# Patient Record
Sex: Male | Born: 1945 | Race: White | Hispanic: No | Marital: Married | Smoking: Never smoker
Health system: Southern US, Community
[De-identification: ages and names within clinical notes are randomized; demographics above are authoritative.]

## PROBLEM LIST (undated history)

## (undated) DIAGNOSIS — R42 Dizziness and giddiness: Secondary | ICD-10-CM

## (undated) DIAGNOSIS — I10 Essential (primary) hypertension: Secondary | ICD-10-CM

## (undated) HISTORY — PX: BACK SURGERY: SHX140

---

## 2017-05-07 DIAGNOSIS — E7800 Pure hypercholesterolemia, unspecified: Secondary | ICD-10-CM | POA: Insufficient documentation

## 2017-05-07 DIAGNOSIS — Z800 Family history of malignant neoplasm of digestive organs: Secondary | ICD-10-CM | POA: Insufficient documentation

## 2017-05-07 DIAGNOSIS — K4090 Unilateral inguinal hernia, without obstruction or gangrene, not specified as recurrent: Secondary | ICD-10-CM | POA: Insufficient documentation

## 2017-06-03 DIAGNOSIS — M5417 Radiculopathy, lumbosacral region: Secondary | ICD-10-CM | POA: Insufficient documentation

## 2017-09-16 DIAGNOSIS — I10 Essential (primary) hypertension: Secondary | ICD-10-CM | POA: Insufficient documentation

## 2019-07-13 ENCOUNTER — Other Ambulatory Visit: Payer: Self-pay | Admitting: Family Medicine

## 2019-07-13 DIAGNOSIS — M25511 Pain in right shoulder: Secondary | ICD-10-CM

## 2019-07-25 ENCOUNTER — Ambulatory Visit
Admission: RE | Admit: 2019-07-25 | Discharge: 2019-07-25 | Disposition: A | Discharge status: Home / Self Care | Payer: Medicare Other | Source: Ambulatory Visit | Attending: Family Medicine | Admitting: Family Medicine

## 2019-07-25 DIAGNOSIS — M25511 Pain in right shoulder: Secondary | ICD-10-CM | POA: Diagnosis present

## 2019-07-25 IMAGING — MR MR SHOULDER*R* W/O CM
5 series · 36 of 40 positions shown · non-contrast
Comparison: Report of shoulder radiographs dated [DATE]

CLINICAL DATA: Chronic progressive right shoulder pain radiating to
the chest wall.

EXAM:
MRI OF THE RIGHT SHOULDER WITHOUT CONTRAST
TECHNIQUE: Multiplanar, multisequence MR imaging of the shoulder was performed.
No intravenous contrast was administered.

[Series 3: PD fat-sat · axial · right · 4.0mm · 0.55mm/px · z∈[+30,+159]mm · 8 of 28 slices shown (1 of 2)]
[im 1/28]
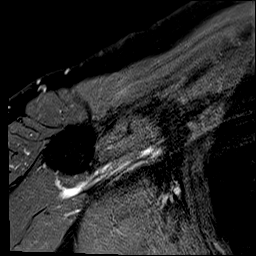
[im 4/28]
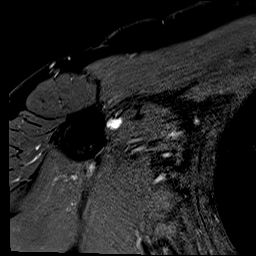
[im 8/28]
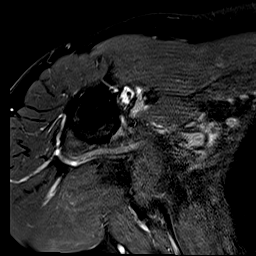
[im 12/28]
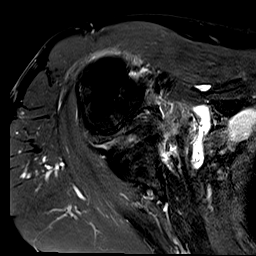
[im 16/28]
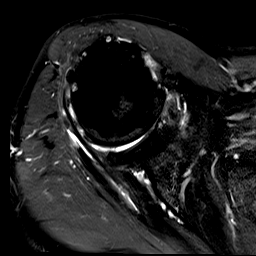
[im 20/28]
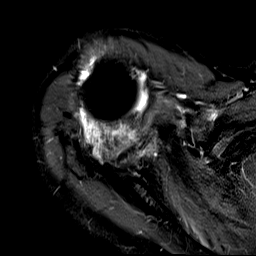
[im 24/28]
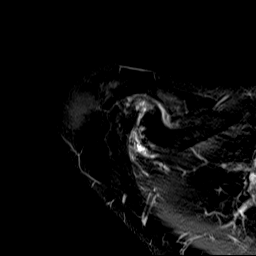
[im 28/28]
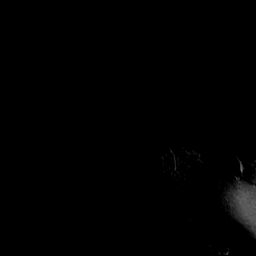

[Series 4: PD fat-sat · oblique · right · 4.0mm · 0.44mm/px · 8 of 26 slices shown (2 of 2)]
[im 1/26]
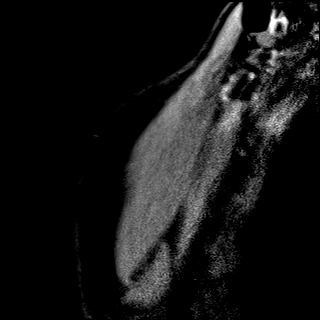
[im 4/26]
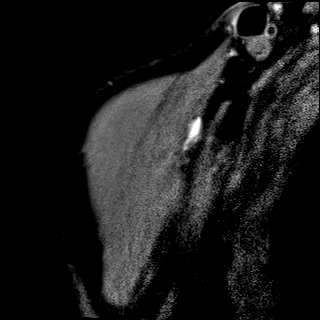
[im 8/26]
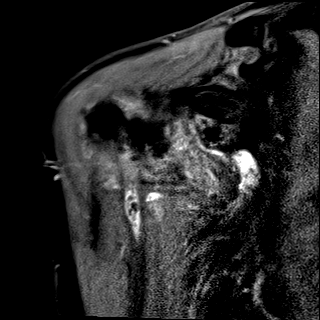
[im 11/26]
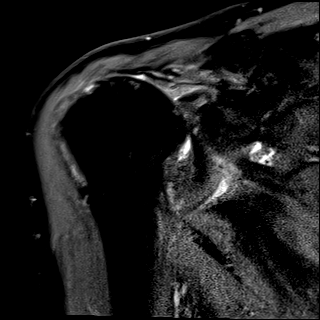
[im 15/26]
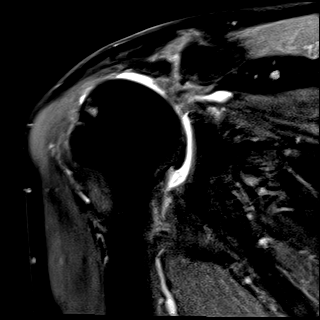
[im 18/26]
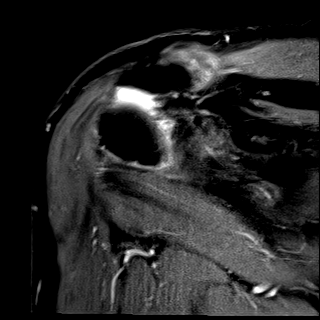
[im 22/26]
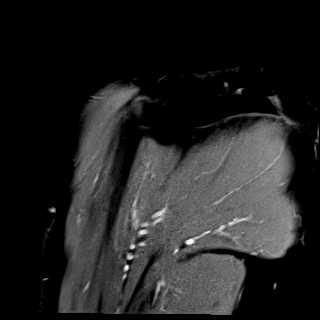
[im 26/26]
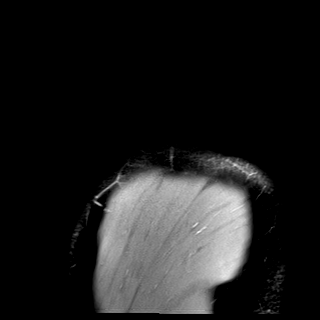

[Series 5: T2 fat-sat · oblique · right · 4.0mm · 0.44mm/px · 8 of 26 slices shown (1 of 2)]
[im 1/26]
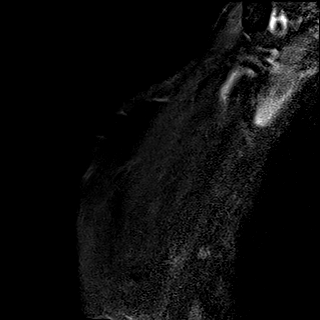
[im 4/26]
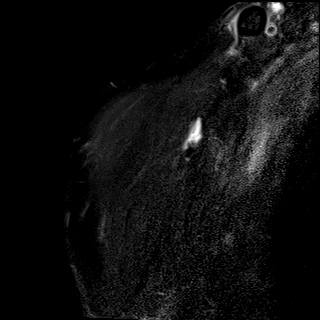
[im 8/26]
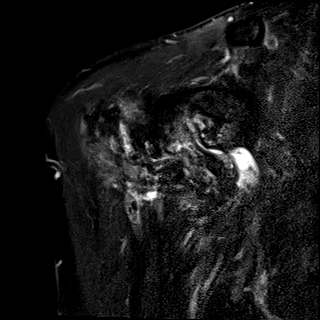
[im 11/26]
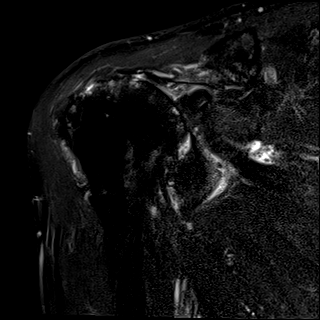
[im 15/26]
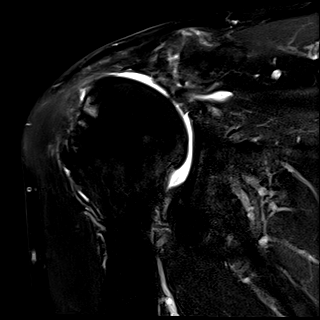
[im 18/26]
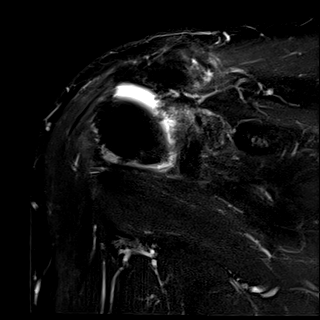
[im 22/26]
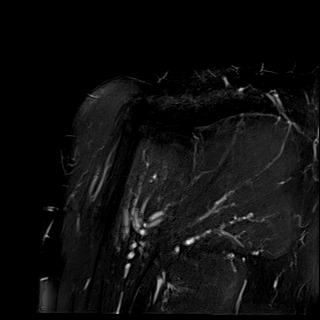
[im 26/26]
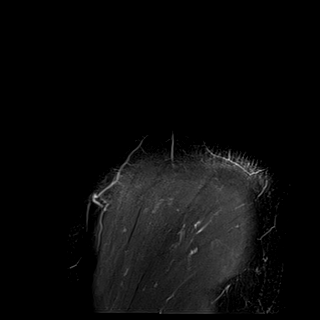

[Series 6: T2 fat-sat · coronal · right · 4.0mm · 0.27mm/px · 8 of 24 slices shown (2 of 2)]
[im 1/24]
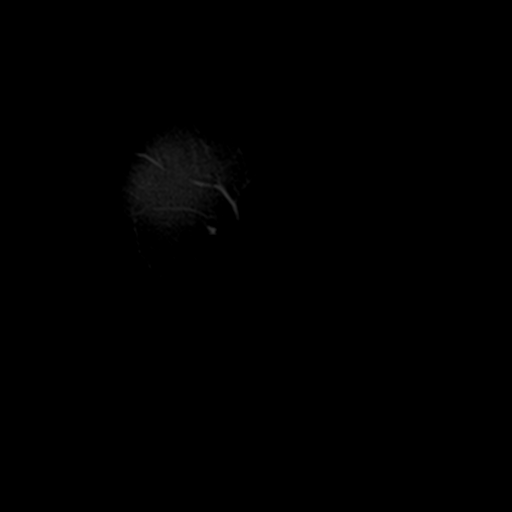
[im 4/24]
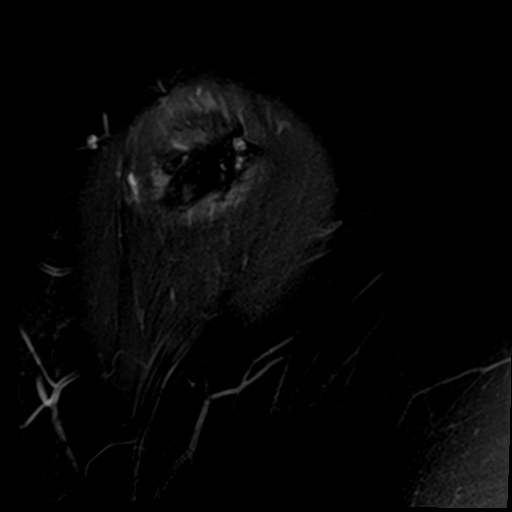
[im 7/24]
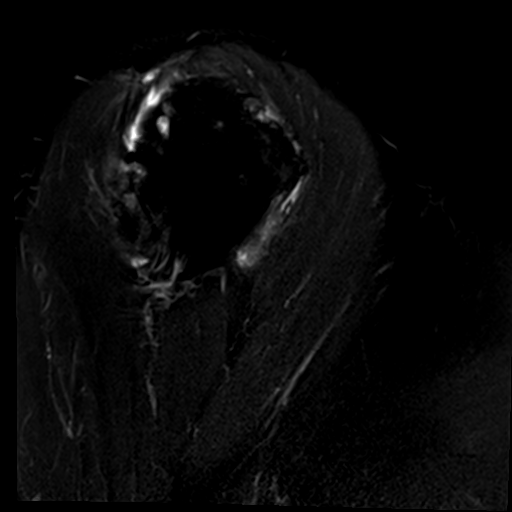
[im 10/24]
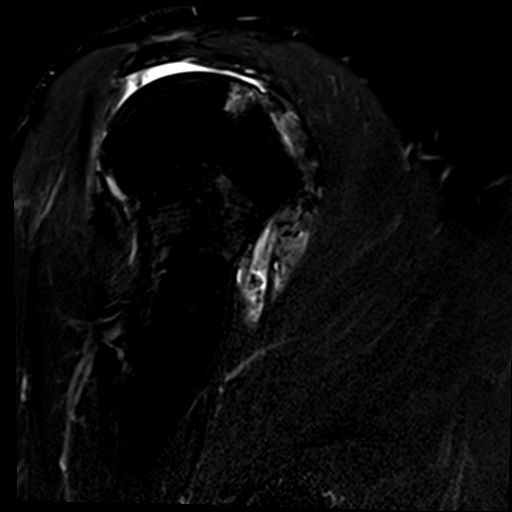
[im 14/24]
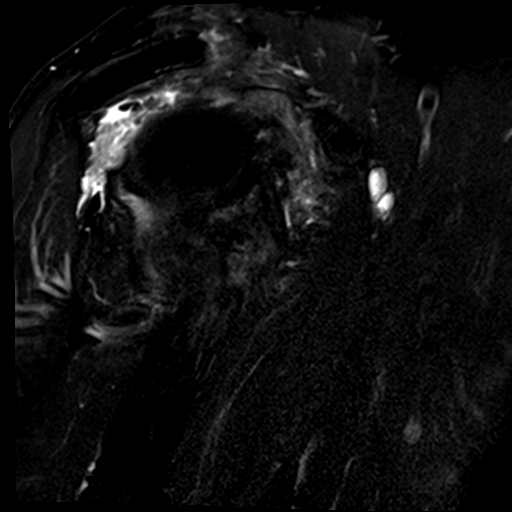
[im 17/24]
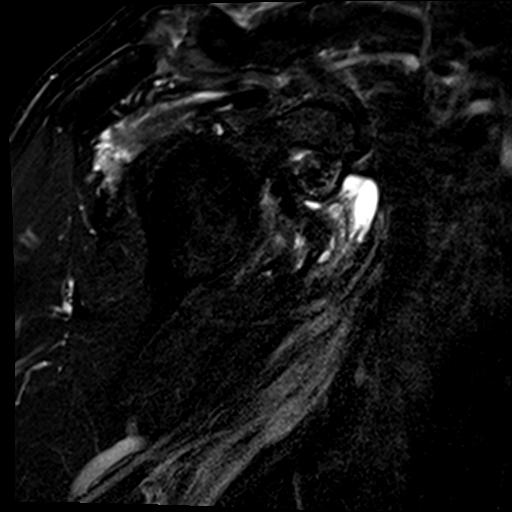
[im 20/24]
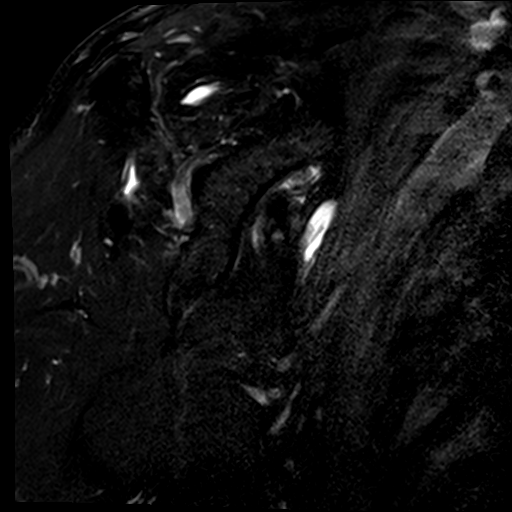
[im 24/24]
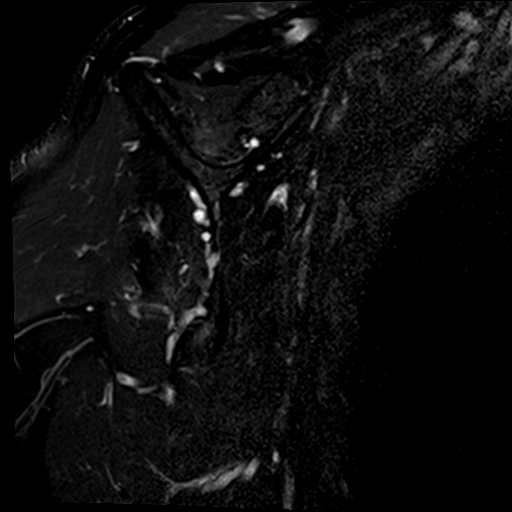

[Series 7: T1 · coronal · right · 4.0mm · 0.44mm/px · 4 of 24 slices shown]
[im 1/24]
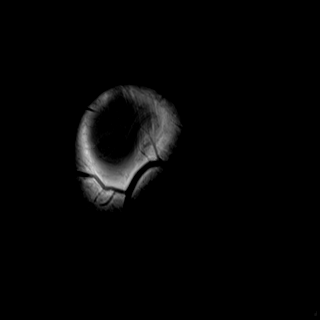
[im 4/24]
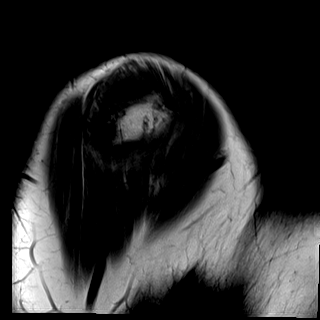
[im 7/24]
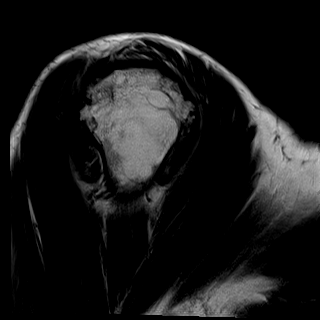
[im 10/24]
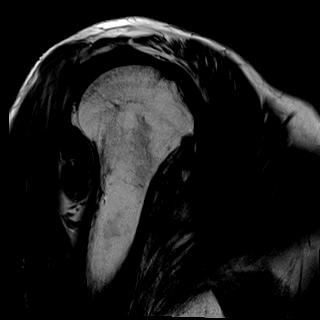

[36 of 40 positions shown; findings below may reference images not displayed]

FINDINGS: Rotator cuff: There are large full-thickness full width retracted
tears of the infraspinatus and supraspinatus tendons. The defect is
at least 7 cm in diameter. Teres minor tendon is intact.
Subscapularis tendon is degenerated but intact.

Muscles: Severe atrophy of the supraspinatus, infraspinatus and
subscapularis muscles. Slight atrophy of the teres minor muscle.

Biceps long head: Long head of the biceps tendon is avulsed from its
origin and distally retracted and not visible. Multiple tiny loose
bodies in the bicipital tendon sheath.

Acromioclavicular Joint: Hypertrophy of the distal clavicle slight
AC joint arthropathy. Type 2 acromion.

Glenohumeral Joint: Moderate glenohumeral joint effusion. Diffuse
thinning of the articular cartilage particularly on the humeral
head. The humeral head is superiorly subluxed and articulates the
undersurface of the acromion. 11 mm ossified loose body in the
subcoracoid recess of the joint.

Labrum: Diffusely degenerated. The anterior labrum is not well seen.
Avulsion of the origin of the long head of the biceps tendon.

Bones:  Osteophytes on the humeral head.

Other: None
IMPRESSION: 1. Large full-thickness full width retracted tears of the
infraspinatus and supraspinatus tendons.
2. Severe atrophy of the supraspinatus, infraspinatus, and
subscapularis muscles.
3. Avulsion of the origin of the long head of the biceps tendon with
distal retraction.
4. Moderate glenohumeral joint effusion with mild to moderate
glenohumeral arthritis with multiple loose bodies in the bicipital
tendon sheath and an 11 mm loose body in the subcoracoid recess.

## 2020-06-12 ENCOUNTER — Other Ambulatory Visit: Payer: Self-pay

## 2020-06-12 ENCOUNTER — Ambulatory Visit
Admission: RE | Admit: 2020-06-12 | Discharge: 2020-06-12 | Disposition: A | Discharge status: Home / Self Care | Payer: Medicare Other | Source: Ambulatory Visit | Attending: Family Medicine | Admitting: Family Medicine

## 2020-06-12 ENCOUNTER — Other Ambulatory Visit (HOSPITAL_COMMUNITY): Payer: Self-pay | Admitting: Family Medicine

## 2020-06-12 ENCOUNTER — Other Ambulatory Visit: Payer: Self-pay | Admitting: Family Medicine

## 2020-06-12 DIAGNOSIS — R519 Headache, unspecified: Secondary | ICD-10-CM | POA: Insufficient documentation

## 2020-08-11 DIAGNOSIS — N528 Other male erectile dysfunction: Secondary | ICD-10-CM | POA: Insufficient documentation

## 2021-04-16 ENCOUNTER — Other Ambulatory Visit (HOSPITAL_COMMUNITY): Payer: Self-pay | Admitting: Physician Assistant

## 2021-04-16 ENCOUNTER — Other Ambulatory Visit: Payer: Self-pay

## 2021-04-16 ENCOUNTER — Ambulatory Visit
Admission: RE | Admit: 2021-04-16 | Discharge: 2021-04-16 | Disposition: A | Discharge status: Home / Self Care | Payer: Medicare Other | Source: Ambulatory Visit | Attending: Physician Assistant | Admitting: Physician Assistant

## 2021-04-16 ENCOUNTER — Other Ambulatory Visit: Payer: Self-pay | Admitting: Physician Assistant

## 2021-04-16 DIAGNOSIS — R310 Gross hematuria: Secondary | ICD-10-CM

## 2021-04-16 LAB — POCT I-STAT CREATININE: Creatinine, Ser: 0.9 mg/dL (ref 0.61–1.24)

## 2021-04-16 MED ORDER — IOHEXOL 300 MG/ML  SOLN
100.0000 mL | Freq: Once | INTRAMUSCULAR | Status: AC | PRN
  Administered 2021-04-16: 100 mL via INTRAVENOUS

## 2021-04-25 ENCOUNTER — Ambulatory Visit (INDEPENDENT_AMBULATORY_CARE_PROVIDER_SITE_OTHER): Payer: Medicare Other | Admitting: Urology

## 2021-04-25 ENCOUNTER — Encounter: Payer: Self-pay | Admitting: Urology

## 2021-04-25 ENCOUNTER — Other Ambulatory Visit: Payer: Self-pay

## 2021-04-25 VITALS — BP 193/76 | HR 80 | Ht 72.0 in | Wt 180.0 lb

## 2021-04-25 DIAGNOSIS — R310 Gross hematuria: Secondary | ICD-10-CM

## 2021-04-25 DIAGNOSIS — N200 Calculus of kidney: Secondary | ICD-10-CM

## 2021-04-25 DIAGNOSIS — N401 Enlarged prostate with lower urinary tract symptoms: Secondary | ICD-10-CM | POA: Diagnosis not present

## 2021-04-25 DIAGNOSIS — N210 Calculus in bladder: Secondary | ICD-10-CM | POA: Diagnosis not present

## 2021-04-25 LAB — MICROSCOPIC EXAMINATION

## 2021-04-25 LAB — URINALYSIS, COMPLETE
Bilirubin, UA: NEGATIVE
Glucose, UA: NEGATIVE
Ketones, UA: NEGATIVE
Leukocytes,UA: NEGATIVE
Nitrite, UA: NEGATIVE
Specific Gravity, UA: >1.03 — ABNORMAL HIGH (ref 1.005–1.030)
Urobilinogen, Ur: 0.2 mg/dL (ref 0.2–1.0)
pH, UA: 5.5 (ref 5.0–7.5)

## 2021-04-25 LAB — BLADDER SCAN AMB NON-IMAGING: Scan Result: 69

## 2021-04-25 NOTE — Progress Notes (Signed)
? ?04/25/2021 ?12:42 PM  ? ?Rodney Marshall ?[birth year] ?[id number] ? ?Referring provider: Ignacia Bayley, PA-C ?[address] ?[facility] ?[place],  [place] [address] ? ?Chief Complaint  ?Patient presents with  ? Hematuria  ? ? ?HPI: ?Rodney Marshall is a 76 y.o. male referred for evaluation of gross hematuria. ? ?1 month history intermittent gross hematuria ?Usually occurs with increased activity ?No flank, abdominal or pelvic pain ?Baseline voiding symptoms including urgency, weak stream and sensation of incomplete emptying ?IPSS today 19/35 ?Last PSA 02/2018 4.07 ?CTU performed 04/16/2021 which showed small, bilateral renal calculi measuring up to 2 mm and bilateral parapelvic cysts ?Marked prostate enlargement with multiple bladder calculi ? ?PMH: ? High cholesterol  ? Hypertension  ? Migraine  ? ?Surgical History: ? ENDOSCOPIC CARPAL TUNNEL RELEASE Left 2012  ? lower back surgery 2016  ? COLONOSCOPY 09/04/2017  ?[facility] 08/2022  ? MICROSURGERY Right 09/22/2017  ?Procedure: MICROSURGICAL TECHNIQUES, REQUIRING USE OF OPERATING MICROSCOPE; Surgeon: Rodney Sat, MD; Location: [facility] OR; Service: Orthopedics; Laterality: Right;  ? ?Home Medications:  ?Allergies as of 04/25/2021   ? ?   Reactions  ? Other   ? Other reaction(s): Muscle Pain  ? ?  ? ?  ?Medication List  ?  ? ?  ? Accurate as of April 25, 2021 12:42 PM. If you have any questions, ask your nurse or doctor.  ?  ?  ? ?  ? ?hydrochlorothiazide 12.5 MG tablet ?Commonly known as: HYDRODIURIL ?Take by mouth. ?  ?ibuprofen 400 MG tablet ?Commonly known as: ADVIL ?Take 1 tablet by mouth daily. ?  ?sildenafil 50 MG tablet ?Commonly known as: VIAGRA ?1/2 to 1 tab 1 hour prior to intercourse ?  ? ?  ? ? ?Allergies:  ?Allergies  ?Allergen Reactions  ? Other   ?  Other reaction(s): Muscle Pain  ? ? ?Family History: ?History reviewed. No pertinent family history. ? ?Social History:  reports that he has never smoked. He has never used smokeless  tobacco. He reports current alcohol use. No history on file for drug use. ? ? ?Physical Exam: ?BP (!) 193/76   Pulse 80   Ht 6' (1.829 m)   Wt 180 lb (81.6 kg)   BMI 24.41 kg/m?   ?Constitutional:  Alert and oriented, No acute distress. ?HEENT: [place] AT, moist mucus membranes.  Trachea midline, no masses. ?Cardiovascular: No clubbing, cyanosis, or edema. ?Respiratory: Normal respiratory effort, no increased work of breathing. ?GI: Abdomen is soft, nontender, nondistended, no abdominal masses ?GU: Prostate 80+ cc; only lower one half palpable.  No nodules or induration ?Skin: No rashes, bruises or suspicious lesions. ?Neurologic: Grossly intact, no focal deficits, moving all 4 extremities. ?Psychiatric: Normal mood and affect. ? ?Laboratory Data: ? ?Urinalysis ?Microscopy 3-10 RBC ? ?Pertinent Imaging: ?CT images were personally reviewed and interpreted.  Prostate volume calculated at 157 cc.  >10 bladder calculi largest measuring 10-14 mm ? ? ?CT HEMATURIA WORKUP ? ?Narrative ?CLINICAL DATA:  Hematuria ? ?EXAM: ?CT ABDOMEN AND PELVIS WITHOUT AND WITH CONTRAST ? ?TECHNIQUE: ?Multidetector CT imaging of the abdomen and pelvis was performed ?following the standard protocol before and following the bolus ?administration of intravenous contrast. ? ?RADIATION DOSE REDUCTION: This exam was performed according to the ?departmental dose-optimization program which includes automated ?exposure control, adjustment of the mA and/or kV according to ?patient size and/or use of iterative reconstruction technique. ? ?CONTRAST:  OMNIPAQUE IOHEXOL 300 MG/ML  SOLN ? ?COMPARISON:  None. ? ?FINDINGS: ?Lower chest:  No acute process identified. Calcified granuloma in the ?left lower lobe. ? ?Hepatobiliary: Liver is normal in size and contour. A few tiny ?hypodensities scattered throughout the liver are too small to ?characterize. Gallbladder appears normal. No biliary ductal ?dilatation identified. ? ?Pancreas: Unremarkable. No  pancreatic ductal dilatation or ?surrounding inflammatory changes. ? ?Spleen: Normal size.  Several calcified granulomas. ? ?Adrenals/Urinary Tract: Adrenal glands appear normal. A few small ?left renal calculi identified measuring up to 2 mm in the lower and ?mid poles. No nephrolithiasis visualized on the right. No ?hydronephrosis or hydroureter. A few small renal cortical cysts as ?well as bilateral parapelvic cysts. No suspicious enhancing renal ?mass identified bilaterally. Ureters are normal caliber. There are ?numerous dependent calculi visualized within the urinary bladder ?measuring up to 12 mm in size. Circumferential urinary bladder wall ?thickening with no focal mass identified. Mild pericystic fat ?stranding. ? ?Stomach/Bowel: No bowel obstruction, free air or pneumatosis. Mild ?colonic diverticulosis. No bowel wall edema identified. Moderate ?amount of retained fecal material throughout the colon. No evidence ?of acute appendicitis. ? ?Vascular/Lymphatic: Aortic atherosclerosis. No enlarged abdominal or ?pelvic lymph nodes. ? ?Reproductive: Prostate gland is markedly enlarged measuring 6.1 x 7 ?x 5.8 cm, with mild indentation on the base of the urinary bladder. ? ?Other: No ascites. ? ?Musculoskeletal: Degenerative changes of the lumbar spine and ?bilateral hips. No suspicious bony lesions identified. ? ?IMPRESSION: ?1. Marked prostatomegaly with evidence of chronic outlet ?obstruction. Numerous calculi within the urinary bladder, as well as ?urinary bladder wall thickening and mild adjacent fat stranding. ?2. Small left nephrolithiasis. ?3. Mild colonic diverticulosis. Other chronic findings as described. ? ? ?Electronically Signed ?By: Rodney Marshall M.D. ?On: 04/16/2021 16:57 ? ?No results found for this or any previous visit. ? ? ?Assessment & Plan:   ? ?1.  Bladder calculi ?CT findings were discussed in detail with Rodney Marshall.  We discussed the need for treatment of his multiple bladder  calculi and cystolitholapaxy was discussed in detail ?Based on prostate volume and lower urinary tract symptoms would also recommend an outlet procedure and based on prostate size HoLEP would be his best option ?Without an outlet procedure we discussed he is at risk for recurrent bladder calculi and urinary retention ? ?2.  BPH with LUTS ?As above ?HoLEP recommended and he will discuss with wife ?Discussed with Dr. Richardo Hanks and will schedule follow-up with him if he desires to pursue HoLEP ? ?3.  Gross hematuria ?Secondary to above ?Cystoscopy will be performed at time of the above procedures and if incidental findings such as a bladder tumor discovered TURBT would also be performed ? ?4.  Bilateral nephrolithiasis ?Small, bilateral renal calculi ?No treatment recommended ? ? ? ? ?Riki Altes, MD ? ?[facility] ?[address] ?[place], [place] [address] ?[phone] ? ?

## 2021-04-25 NOTE — Patient Instructions (Signed)

## 2021-04-26 ENCOUNTER — Telehealth: Payer: Self-pay | Admitting: Urology

## 2021-04-26 NOTE — Telephone Encounter (Signed)
Pt had appt with you yesterday.  He called office this morning and would like to proceed w/HOLEP.   ?

## 2021-05-03 ENCOUNTER — Ambulatory Visit (INDEPENDENT_AMBULATORY_CARE_PROVIDER_SITE_OTHER): Payer: Medicare Other | Admitting: Urology

## 2021-05-03 ENCOUNTER — Other Ambulatory Visit: Payer: Self-pay | Admitting: Urology

## 2021-05-03 ENCOUNTER — Encounter: Payer: Self-pay | Admitting: Urology

## 2021-05-03 ENCOUNTER — Telehealth: Payer: Self-pay

## 2021-05-03 VITALS — BP 143/84 | HR 84 | Ht 72.0 in | Wt 175.0 lb

## 2021-05-03 DIAGNOSIS — N210 Calculus in bladder: Secondary | ICD-10-CM | POA: Diagnosis not present

## 2021-05-03 DIAGNOSIS — N401 Enlarged prostate with lower urinary tract symptoms: Secondary | ICD-10-CM

## 2021-05-03 DIAGNOSIS — N138 Other obstructive and reflux uropathy: Secondary | ICD-10-CM | POA: Diagnosis not present

## 2021-05-03 NOTE — Progress Notes (Signed)
Surgical Physician Order Form [facility] ? ?* Scheduling expectation :  05/21/2021 ? ?*Length of Case: 2 hours ? ?*Clearance needed: no ? ?*Anticoagulation Instructions: Hold all anticoagulants ? ?*Aspirin Instructions: Hold Aspirin ? ?*Post-op visit Date/Instructions:  1-3 day cath removal, 3 months MD PVR ? ?*Diagnosis: BPH w/BOO and bladder stones ? ?*Procedure: Cystolitholapaxy and HOLEP ? ? ?Additional orders: N/A ? ?-Admit type: OUTpatient ? ?-Anesthesia: General ? ?-VTE Prophylaxis Standing Order SCD?s    ?   ?Other:  ? ?-Standing Lab Orders Per Anesthesia   ? ?Lab other: None ? ?-Standing Test orders EKG/Chest x-ray per Anesthesia      ? ?Test other:  ? ?- Medications:  Ancef 2gm IV ? ?-Other orders:  N/A ? ? ? ?  ? ?

## 2021-05-03 NOTE — Progress Notes (Signed)
[facility] Posting Form  ? ?Surgery Date/Time: Date: 05/21/2021 ? ?Surgeon: Dr. Nickolas Madrid, MD ? ?Surgery Location: Day Surgery ? ?Inpt ( No  )   Outpt (Yes)   Obs ( No  )  ? ?Diagnosis: N40.1, N13.8 Benign Prostatic Hyperplasia with Bladder Outlet Obstruction; N21.0 Bladder Stones ? ?-CPT: [id number], [phone] ? ?Surgery: Cystolitholapaxy, Holmium Laser Enucleation of the Prostate ? ?Stop Anticoagulations: Yes also hold ASA ? ?Cardiac/Medical/Pulmonary Clearance needed: no ? ?*Orders entered into EPIC  Date: 05/03/21  ? ?*Case booked in [place]  Date: 05/03/21 ? ?*Notified pt of Surgery: Date: 05/03/21 ? ?PRE-OP UA & CX: no ? ?*Placed into Prior Authorization Work Que Date: 05/03/21 ? ? ?Assistant/laser/rep:No ? ? ? ? ? ? ? ? ? ? ? ? ? ? ? ?

## 2021-05-03 NOTE — Progress Notes (Signed)
? ?  05/03/2021 ?11:27 AM  ? ?Rodney Marshall ?1945/08/16 ?[id number] ? ?Reason for visit: Follow up hematuria, BPH, bladder stones, discuss HOLEP ? ?HPI: ?76 year old male recently seen by Dr. Lonna Cobb on 04/25/2021 for gross hematuria.  A CT had already been performed which showed numerous bladder stones, and enlarged prostate at least 100 g.  IPSS score was 19, and he had urinary symptoms of weak stream, urgency, frequency, and feeling of incomplete emptying.  PSA in February 2020 was 4.1 which was normal for his age. ? ?I recommended cystolitholapaxy with simultaneous outlet procedure with HOLEP. We discussed the risks and benefits of HoLEP at length.  The procedure requires general anesthesia and takes 1 to 2 hours, and a holmium laser is used to enucleate the prostate and push this tissue into the bladder.  A morcellator is then used to remove this tissue, which is sent for pathology.  The vast majority(>95%) of patients are able to discharge the same day with a catheter in place for 2 to 3 days, and will follow-up in clinic for a voiding trial.  We specifically discussed the risks of bleeding, infection, retrograde ejaculation, temporary urgency and urge incontinence, very low risk of long-term incontinence, urethral stricture/bladder neck contracture, pathologic evaluation of prostate tissue and possible detection of prostate cancer or other malignancy, and possible need for additional procedures.  We discussed the potential of finding bladder tumor or other etiology of gross hematuria that could require additional procedures at the time of surgery. ? ?Schedule cystolitholapaxy and HOLEP ? ?Sondra Come, MD ? ?[facility] ?[address] ?[place], [place] [address] ?[phone] ? ? ?

## 2021-05-03 NOTE — Telephone Encounter (Signed)
I spoke with Rodney Marshall and his wife while in office. We have discussed possible surgery dates and Monday April 24th, 2023 was agreed upon by all parties. Patient given information about surgery date, what to expect pre-operatively and post operatively.  ?We discussed that a Pre-Admission Testing office will be calling to set up the pre-op visit that will take place prior to surgery, and that these appointments are typically done over the phone with a Pre-Admissions RN.  ?Informed patient that our office will communicate any additional care to be provided after surgery. Patients questions or concerns were discussed during our call. Advised to call our office should there be any additional information, questions or concerns that arise. Patient verbalized understanding.  ? ? ? ? ? ? ? ? ? ? ? ? ? ? ?

## 2021-05-03 NOTE — Patient Instructions (Signed)

## 2021-05-11 ENCOUNTER — Encounter
Admission: RE | Admit: 2021-05-11 | Discharge: 2021-05-11 | Disposition: A | Discharge status: Home / Self Care | Payer: Medicare Other | Source: Ambulatory Visit | Attending: Urology | Admitting: Urology

## 2021-05-11 ENCOUNTER — Other Ambulatory Visit: Payer: Self-pay

## 2021-05-11 DIAGNOSIS — I2510 Atherosclerotic heart disease of native coronary artery without angina pectoris: Secondary | ICD-10-CM

## 2021-05-11 NOTE — Patient Instructions (Addendum)
Your procedure is scheduled on: 05/21/2021 ?Report to the Registration Desk on the 1st floor of the [facility]. ?To find out your arrival time, please call [phone] between 1PM - 3PM on: 05/18/2021 ? ?REMEMBER: ?Instructions that are not followed completely may result in serious medical risk, up to and including death; or upon the discretion of your surgeon and anesthesiologist your surgery may need to be rescheduled. ? ?Do not eat food after midnight the night before surgery.  ?No gum chewing, lozengers or hard candies. ? ?One week prior to surgery: ?Stop Anti-inflammatories (NSAIDS) such as Advil, Aleve, Ibuprofen, Motrin, Naproxen, Naprosyn and Aspirin based products such as Excedrin, Goodys Powder, BC Powder. ? ?Stop ANY OVER THE COUNTER supplements until after surgery. ?You may however, continue to take Tylenol if needed for pain up until the day of surgery. ? ?No Alcohol for 24 hours before or after surgery. ? ?No Smoking including e-cigarettes for 24 hours prior to surgery.  ?No chewable tobacco products for at least 6 hours prior to surgery.  ?No nicotine patches on the day of surgery. ? ?Do not use any "recreational" drugs for at least a week prior to your surgery.  ?Please be advised that the combination of cocaine and anesthesia may have negative outcomes, up to and including death. ?If you test positive for cocaine, your surgery will be cancelled. ? ?On the morning of surgery brush your teeth with toothpaste and water, you may rinse your mouth with mouthwash if you wish. ?Do not swallow any toothpaste or mouthwash. ? ?Shower with antibacterial soap like dial. ? ?Do not wear jewelry. ? ?Do not wear lotions, powders, or perfumes.  ? ?Do not shave body from the neck down 48 hours prior to surgery just in case you cut yourself which could leave a site for infection.  ? ? ?Contact lenses, hearing aids and dentures may not be worn into surgery. ? ?Do not bring valuables to the hospital. [facility] is  not responsible for any missing/lost belongings or valuables.  ? ? ?Notify your doctor if there is any change in your medical condition (cold, fever, infection). ? ?Wear comfortable clothing (specific to your surgery type) to the hospital. ? ?After surgery, you can help prevent lung complications by doing breathing exercises.  ?Take deep breaths and cough every 1-2 hours. Your doctor may order a device called an Incentive Spirometer to help you take deep breaths. ? ?If you are being admitted to the hospital overnight, leave your suitcase in the car. ?After surgery it may be brought to your room. ? ?If you are being discharged the day of surgery, you will not be allowed to drive home. ?You will need a responsible adult (18 years or older) to drive you home and stay with you that night.  ? ?If you are taking public transportation, you will need to have a responsible adult (18 years or older) with you. ?Please confirm with your physician that it is acceptable to use public transportation.  ? ?Please call the Pre-admissions Testing Dept. at [phone] if you have any questions about these instructions. ? ?Surgery Visitation Policy: ? ?Patients undergoing a surgery or procedure may have two family members or support persons with them as long as the person is not COVID-19 positive or experiencing its symptoms.  ? ? ?

## 2021-05-16 ENCOUNTER — Encounter
Admission: RE | Admit: 2021-05-16 | Discharge: 2021-05-16 | Disposition: A | Discharge status: Home / Self Care | Payer: Medicare Other | Source: Ambulatory Visit | Attending: Urology | Admitting: Urology

## 2021-05-16 DIAGNOSIS — I2510 Atherosclerotic heart disease of native coronary artery without angina pectoris: Secondary | ICD-10-CM | POA: Diagnosis not present

## 2021-05-16 DIAGNOSIS — Z01818 Encounter for other preprocedural examination: Secondary | ICD-10-CM | POA: Insufficient documentation

## 2021-05-16 LAB — CBC
HCT: 50 % (ref 39.0–52.0)
Hemoglobin: 16.6 g/dL (ref 13.0–17.0)
MCH: 30.2 pg (ref 26.0–34.0)
MCHC: 33.2 g/dL (ref 30.0–36.0)
MCV: 90.9 fL (ref 80.0–100.0)
Platelets: 270 K/uL (ref 150–400)
RBC: 5.5 MIL/uL (ref 4.22–5.81)
RDW: 12.9 % (ref 11.5–15.5)
WBC: 7.6 K/uL (ref 4.0–10.5)
nRBC: 0 % (ref 0.0–0.2)

## 2021-05-21 ENCOUNTER — Ambulatory Visit
Admission: RE | Admit: 2021-05-21 | Discharge: 2021-05-21 | Disposition: A | Discharge status: Home / Self Care | Payer: Medicare Other | Attending: Urology | Admitting: Urology

## 2021-05-21 ENCOUNTER — Other Ambulatory Visit: Payer: Self-pay

## 2021-05-21 ENCOUNTER — Encounter: Payer: Self-pay | Admitting: Urology

## 2021-05-21 ENCOUNTER — Ambulatory Visit: Payer: Medicare Other | Admitting: Anesthesiology

## 2021-05-21 ENCOUNTER — Encounter
Admission: RE | Disposition: A | Discharge status: Home / Self Care | Payer: Self-pay | Source: Home / Self Care | Attending: Urology

## 2021-05-21 DIAGNOSIS — N138 Other obstructive and reflux uropathy: Secondary | ICD-10-CM

## 2021-05-21 DIAGNOSIS — N401 Enlarged prostate with lower urinary tract symptoms: Secondary | ICD-10-CM | POA: Insufficient documentation

## 2021-05-21 DIAGNOSIS — N210 Calculus in bladder: Secondary | ICD-10-CM | POA: Diagnosis not present

## 2021-05-21 DIAGNOSIS — R310 Gross hematuria: Secondary | ICD-10-CM | POA: Insufficient documentation

## 2021-05-21 HISTORY — PX: CYSTOSCOPY WITH LITHOLAPAXY: SHX1425

## 2021-05-21 HISTORY — PX: HOLEP-LASER ENUCLEATION OF THE PROSTATE WITH MORCELLATION: SHX6641

## 2021-05-21 SURGERY — CYSTOSCOPY, WITH BLADDER CALCULUS LITHOLAPAXY
Anesthesia: General

## 2021-05-21 MED ORDER — ACETAMINOPHEN 10 MG/ML IV SOLN
INTRAVENOUS | Status: DC | PRN
  Administered 2021-05-21: 1000 mg via INTRAVENOUS

## 2021-05-21 MED ORDER — EPHEDRINE 5 MG/ML INJ
INTRAVENOUS | Status: AC
  Filled 2021-05-21: qty 5

## 2021-05-21 MED ORDER — SUGAMMADEX SODIUM 200 MG/2ML IV SOLN
INTRAVENOUS | Status: DC | PRN
  Administered 2021-05-21: 180 mg via INTRAVENOUS

## 2021-05-21 MED ORDER — CEFAZOLIN SODIUM-DEXTROSE 2-4 GM/100ML-% IV SOLN
INTRAVENOUS | Status: AC
  Filled 2021-05-21: qty 100

## 2021-05-21 MED ORDER — CEFAZOLIN SODIUM-DEXTROSE 2-4 GM/100ML-% IV SOLN
2.0000 g | INTRAVENOUS | Status: AC
  Administered 2021-05-21: 2 g via INTRAVENOUS

## 2021-05-21 MED ORDER — FENTANYL CITRATE (PF) 100 MCG/2ML IJ SOLN
INTRAMUSCULAR | Status: AC
  Filled 2021-05-21: qty 2

## 2021-05-21 MED ORDER — DEXAMETHASONE SODIUM PHOSPHATE 10 MG/ML IJ SOLN
INTRAMUSCULAR | Status: AC
  Filled 2021-05-21: qty 1

## 2021-05-21 MED ORDER — LACTATED RINGERS IV SOLN: INTRAVENOUS | Status: DC

## 2021-05-21 MED ORDER — SODIUM CHLORIDE 0.9 % IR SOLN
Status: DC | PRN
  Administered 2021-05-21: 63000 mL via INTRAVESICAL

## 2021-05-21 MED ORDER — DEXAMETHASONE SODIUM PHOSPHATE 10 MG/ML IJ SOLN
INTRAMUSCULAR | Status: DC | PRN
  Administered 2021-05-21: 10 mg via INTRAVENOUS

## 2021-05-21 MED ORDER — PHENYLEPHRINE 80 MCG/ML (10ML) SYRINGE FOR IV PUSH (FOR BLOOD PRESSURE SUPPORT)
PREFILLED_SYRINGE | INTRAVENOUS | Status: DC | PRN
Start: 2021-05-21 — End: 2021-05-21
  Administered 2021-05-21 (×3): 80 ug via INTRAVENOUS

## 2021-05-21 MED ORDER — OXYCODONE HCL 5 MG PO TABS
ORAL_TABLET | ORAL | Status: AC
  Filled 2021-05-21: qty 1

## 2021-05-21 MED ORDER — ONDANSETRON HCL 4 MG/2ML IJ SOLN
INTRAMUSCULAR | Status: DC | PRN
  Administered 2021-05-21: 4 mg via INTRAVENOUS

## 2021-05-21 MED ORDER — ROCURONIUM BROMIDE 100 MG/10ML IV SOLN
INTRAVENOUS | Status: DC | PRN
  Administered 2021-05-21: 20 mg via INTRAVENOUS
  Administered 2021-05-21: 10 mg via INTRAVENOUS
  Administered 2021-05-21: 40 mg via INTRAVENOUS
  Administered 2021-05-21: 20 mg via INTRAVENOUS

## 2021-05-21 MED ORDER — FAMOTIDINE 20 MG PO TABS: 20.0000 mg | ORAL_TABLET | Freq: Once | ORAL | Status: AC

## 2021-05-21 MED ORDER — OXYCODONE HCL 5 MG PO TABS
5.0000 mg | ORAL_TABLET | Freq: Once | ORAL | Status: AC
  Administered 2021-05-21: 5 mg via ORAL

## 2021-05-21 MED ORDER — ACETAMINOPHEN 10 MG/ML IV SOLN
INTRAVENOUS | Status: AC
  Filled 2021-05-21: qty 100

## 2021-05-21 MED ORDER — CHLORHEXIDINE GLUCONATE 0.12 % MT SOLN
OROMUCOSAL | Status: AC
  Filled 2021-05-21: qty 15

## 2021-05-21 MED ORDER — FENTANYL CITRATE (PF) 100 MCG/2ML IJ SOLN
INTRAMUSCULAR | Status: DC | PRN
  Administered 2021-05-21 (×2): 50 ug via INTRAVENOUS

## 2021-05-21 MED ORDER — FAMOTIDINE 20 MG PO TABS
ORAL_TABLET | ORAL | Status: AC
  Administered 2021-05-21: 20 mg via ORAL
  Filled 2021-05-21: qty 1

## 2021-05-21 MED ORDER — ORAL CARE MOUTH RINSE: 15.0000 mL | Freq: Once | OROMUCOSAL | Status: AC

## 2021-05-21 MED ORDER — ONDANSETRON HCL 4 MG/2ML IJ SOLN: 4.0000 mg | Freq: Once | INTRAMUSCULAR | Status: DC | PRN

## 2021-05-21 MED ORDER — OXYCODONE HCL 5 MG PO TABS: 5.0000 mg | ORAL_TABLET | Freq: Three times a day (TID) | ORAL | 0 refills | Status: AC | PRN

## 2021-05-21 MED ORDER — DEXMEDETOMIDINE HCL IN NACL 200 MCG/50ML IV SOLN
INTRAVENOUS | Status: DC | PRN
  Administered 2021-05-21 (×2): 8 ug via INTRAVENOUS
  Administered 2021-05-21: 4 ug via INTRAVENOUS

## 2021-05-21 MED ORDER — PROPOFOL 10 MG/ML IV BOLUS
INTRAVENOUS | Status: DC | PRN
  Administered 2021-05-21: 50 mg via INTRAVENOUS
  Administered 2021-05-21: 100 mg via INTRAVENOUS

## 2021-05-21 MED ORDER — ROCURONIUM BROMIDE 10 MG/ML (PF) SYRINGE
PREFILLED_SYRINGE | INTRAVENOUS | Status: AC
  Filled 2021-05-21: qty 10

## 2021-05-21 MED ORDER — FENTANYL CITRATE (PF) 100 MCG/2ML IJ SOLN: 25.0000 ug | INTRAMUSCULAR | Status: DC | PRN

## 2021-05-21 MED ORDER — ONDANSETRON HCL 4 MG/2ML IJ SOLN
INTRAMUSCULAR | Status: AC
  Filled 2021-05-21: qty 2

## 2021-05-21 MED ORDER — DEXMEDETOMIDINE HCL IN NACL 80 MCG/20ML IV SOLN
INTRAVENOUS | Status: AC
Start: 2021-05-21 — End: ?
  Filled 2021-05-21: qty 20

## 2021-05-21 MED ORDER — CHLORHEXIDINE GLUCONATE 0.12 % MT SOLN
15.0000 mL | Freq: Once | OROMUCOSAL | Status: AC
  Administered 2021-05-21: 15 mL via OROMUCOSAL

## 2021-05-21 MED ORDER — EPHEDRINE SULFATE (PRESSORS) 50 MG/ML IJ SOLN
INTRAMUSCULAR | Status: DC | PRN
Start: 2021-05-21 — End: 2021-05-21
  Administered 2021-05-21: 10 mg via INTRAVENOUS

## 2021-05-21 SURGICAL SUPPLY — 44 items
ADAPTER IRRIG TUBE 2 SPIKE SOL (ADAPTER) ×4 IMPLANT
ADPR TBG 2 SPK PMP STRL ASCP (ADAPTER) ×2
BAG DRAIN CYSTO-URO LG1000N (MISCELLANEOUS) ×2 IMPLANT
BAG DRN LRG CPC RND TRDRP CNTR (MISCELLANEOUS) ×1
BAG URO DRAIN 4000ML (MISCELLANEOUS) ×2 IMPLANT
CATH FOLEY 3WAY 30CC 24FR (CATHETERS) ×2
CATH URETL OPEN END 4X70 (CATHETERS) ×2 IMPLANT
CATH URTH STD 24FR FL 3W 2 (CATHETERS) ×1 IMPLANT
CNTNR SPEC 2.5X3XGRAD LEK (MISCELLANEOUS)
CONT SPEC 4OZ STER OR WHT (MISCELLANEOUS)
CONT SPEC 4OZ STRL OR WHT (MISCELLANEOUS)
CONTAINER COLLECT MORCELLATR (MISCELLANEOUS) ×1 IMPLANT
CONTAINER SPEC 2.5X3XGRAD LEK (MISCELLANEOUS) IMPLANT
DRAPE UTILITY 15X26 TOWEL STRL (DRAPES) IMPLANT
ELECT BIVAP BIPO 22/24 DONUT (ELECTROSURGICAL)
ELECTRD BIVAP BIPO 22/24 DONUT (ELECTROSURGICAL) IMPLANT
FIBER LASER MOSES 550 DFL (Laser) ×2 IMPLANT
FILTER OVERFLOW MORCELLATOR (FILTER) ×1 IMPLANT
GAUZE 4X4 16PLY ~~LOC~~+RFID DBL (SPONGE) ×2 IMPLANT
GLOVE SURG UNDER POLY LF SZ7.5 (GLOVE) ×2 IMPLANT
GOWN STRL REUS W/ TWL LRG LVL3 (GOWN DISPOSABLE) ×1 IMPLANT
GOWN STRL REUS W/ TWL XL LVL3 (GOWN DISPOSABLE) ×1 IMPLANT
GOWN STRL REUS W/TWL LRG LVL3 (GOWN DISPOSABLE) ×2
GOWN STRL REUS W/TWL XL LVL3 (GOWN DISPOSABLE) ×2
HOLDER FOLEY CATH W/STRAP (MISCELLANEOUS) ×2 IMPLANT
IV NS IRRIG 3000ML ARTHROMATIC (IV SOLUTION) ×26 IMPLANT
KIT PROBE TRILOGY 3.9X350 (MISCELLANEOUS) IMPLANT
KIT TURNOVER CYSTO (KITS) ×2 IMPLANT
MANIFOLD NEPTUNE II (INSTRUMENTS) ×1 IMPLANT
MBRN O SEALING YLW 17 FOR INST (MISCELLANEOUS) ×2
MEMBRANE SLNG YLW 17 FOR INST (MISCELLANEOUS) ×1 IMPLANT
MORCELLATOR COLLECT CONTAINER (MISCELLANEOUS) ×2
MORCELLATOR OVERFLOW FILTER (FILTER) ×2
MORCELLATOR ROTATION 4.75 335 (MISCELLANEOUS) ×2 IMPLANT
PACK CYSTO AR (MISCELLANEOUS) ×2 IMPLANT
SET CYSTO W/LG BORE CLAMP LF (SET/KITS/TRAYS/PACK) ×2 IMPLANT
SET IRRIG Y TYPE TUR BLADDER L (SET/KITS/TRAYS/PACK) ×2 IMPLANT
SLEEVE PROTECTION STRL DISP (MISCELLANEOUS) ×4 IMPLANT
SURGILUBE 2OZ TUBE FLIPTOP (MISCELLANEOUS) ×2 IMPLANT
SYR TOOMEY IRRIG 70ML (MISCELLANEOUS) ×2
SYRINGE TOOMEY IRRIG 70ML (MISCELLANEOUS) ×1 IMPLANT
TUBE PUMP MORCELLATOR PIRANHA (TUBING) ×2 IMPLANT
WATER STERILE IRR 1000ML POUR (IV SOLUTION) ×1 IMPLANT
WATER STERILE IRR 500ML POUR (IV SOLUTION) ×2 IMPLANT

## 2021-05-21 NOTE — Anesthesia Preprocedure Evaluation (Signed)
Anesthesia Evaluation  ?Patient identified by MRN, date of birth, ID band ?Patient awake ? ? ? ?Reviewed: ?Allergy & Precautions, H&P , NPO status , Patient's Chart, lab work & pertinent test results, reviewed documented beta blocker date and time  ? ?History of Anesthesia Complications ?Negative for: history of anesthetic complications ? ?Airway ?Mallampati: III ? ?TM Distance: >3 FB ?Neck ROM: full ? ? ? Dental ? ?(+) Dental Advidsory Given, Caps, Missing, Teeth Intact ?Permanent bridge on top right:   ?Pulmonary ?neg pulmonary ROS,  ?  ?Pulmonary exam normal ?breath sounds clear to auscultation ? ? ? ? ? ? Cardiovascular ?Exercise Tolerance: Good ?hypertension, (-) angina(-) Past MI and (-) Cardiac Stents Normal cardiovascular exam(-) dysrhythmias (-) Valvular Problems/Murmurs ?Rhythm:regular Rate:Normal ? ? ?  ?Neuro/Psych ?negative neurological ROS ? negative psych ROS  ? GI/Hepatic ?negative GI ROS, Neg liver ROS,   ?Endo/Other  ?negative endocrine ROS ? Renal/GU ?negative Renal ROS  ?negative genitourinary ?  ?Musculoskeletal ? ? Abdominal ?  ?Peds ? Hematology ?negative hematology ROS ?(+)   ?Anesthesia Other Findings ?History reviewed. No pertinent past medical history. ? ? Reproductive/Obstetrics ?negative OB ROS ? ?  ? ? ? ? ? ? ? ? ? ? ? ? ? ?  ?  ? ? ? ? ? ? ? ? ?Anesthesia Physical ?Anesthesia Plan ? ?ASA: 2 ? ?Anesthesia Plan: General  ? ?Post-op Pain Management:   ? ?Induction: Intravenous ? ?PONV Risk Score and Plan: 2 and Ondansetron, Dexamethasone and Treatment may vary due to age or medical condition ? ?Airway Management Planned: Oral ETT ? ?Additional Equipment:  ? ?Intra-op Plan:  ? ?Post-operative Plan: Extubation in OR ? ?Informed Consent: I have reviewed the patients History and Physical, chart, labs and discussed the procedure including the risks, benefits and alternatives for the proposed anesthesia with the patient or authorized representative who has  indicated his/her understanding and acceptance.  ? ? ? ?Dental Advisory Given ? ?Plan Discussed with: Anesthesiologist, CRNA and Surgeon ? ?Anesthesia Plan Comments:   ? ? ? ? ? ? ?Anesthesia Quick Evaluation ? ?

## 2021-05-21 NOTE — Anesthesia Procedure Notes (Signed)
Procedure Name: Intubation ?Date/Time: 05/21/2021 8:34 AM ?Performed by: Jonna Clark, CRNA ?Pre-anesthesia Checklist: Patient identified, Patient being monitored, Timeout performed, Emergency Drugs available and Suction available ?Patient Re-evaluated:Patient Re-evaluated prior to induction ?Oxygen Delivery Method: Circle system utilized ?Preoxygenation: Pre-oxygenation with 100% oxygen ?Induction Type: IV induction and Cricoid Pressure applied ?Ventilation: Mask ventilation without difficulty ?Laryngoscope Size: Mac and 4 ?Grade View: Grade II ?Tube type: Oral ?Tube size: 7.5 mm ?Number of attempts: 1 ?Placement Confirmation: ETT inserted through vocal cords under direct vision, positive ETCO2 and breath sounds checked- equal and bilateral ?Secured at: 21 cm ?Tube secured with: Tape ?Dental Injury: Teeth and Oropharynx as per pre-operative assessment  ? ? ? ? ?

## 2021-05-21 NOTE — Transfer of Care (Signed)
Immediate Anesthesia Transfer of Care Note ? ?Patient: Rodney Marshall ? ?Procedure(s) Performed: CYSTOSCOPY WITH LITHOLAPAXY ?HOLEP-LASER ENUCLEATION OF THE PROSTATE WITH MORCELLATION ? ?Patient Location: PACU ? ?Anesthesia Type:General ? ?Level of Consciousness: drowsy and patient cooperative ? ?Airway & Oxygen Therapy: Patient Spontanous Breathing and Patient connected to nasal cannula oxygen ? ?Post-op Assessment: Report given to RN and Post -op Vital signs reviewed and stable ? ?Post vital signs: Reviewed and stable ? ?Last Vitals:  ?Vitals Value Taken Time  ?BP 126/74 05/21/21 1100  ?Temp 36.3 ?C 05/21/21 1100  ?Pulse 50 05/21/21 1105  ?Resp 19 05/21/21 1105  ?SpO2 100 % 05/21/21 1105  ?Vitals shown include unvalidated device data. ? ?Last Pain:  ?Vitals:  ? 05/21/21 0743  ?TempSrc: Tympanic  ?   ? ?  ? ?Complications: No notable events documented. ?

## 2021-05-21 NOTE — Op Note (Signed)
Date of procedure: 05/21/21 ? ?Preoperative diagnosis:  ?Bladder stones ?BPH with obstruction ? ?Postoperative diagnosis:  ?Same ? ?Procedure: ?Cystolitholapaxy, >2.5cm ?HoLEP (Holmium Laser Enucleation of the Prostate) ? ?Surgeon: Legrand Rams, MD ? ?Anesthesia: General ? ?Complications: None ? ?Intraoperative findings:  ?~10 bladder stones, largest ~3cm, fragmented and extracted from bladder ?2.  Moderate trabeculations, no suspicious lesions ?3.  Large prostate with obstructing lateral lobes, ureteral orifices and verumontanum intact at conclusion of case, excellent hemostasis ? ?EBL: Minimal ? ?Specimens: Prostate chips ? ?Enucleation time: 29 minutes ? ?Morcellation time: 20 minutes ? ?Intra-op weight: 100g ? ?Drains: 24 French three-way, 60 cc in balloon ? ?Indication: Rodney Marshall is a 76 y.o. patient with obstructive urinary symptoms, gross hematuria as well as numerous bladder stones on CT.  After reviewing the management options for treatment, they elected to proceed with the above surgical procedure(s). We have discussed the potential benefits and risks of the procedure, side effects of the proposed treatment, the likelihood of the patient achieving the goals of the procedure, and any potential problems that might occur during the procedure or recuperation.  We specifically discussed the risks of bleeding, infection, hematuria and clot retention, need for additional procedures, possible overnight hospital stay, temporary urgency and incontinence, rare long-term incontinence, and retrograde ejaculation.  Informed consent has been obtained.  ? ?Description of procedure: ? ?The patient was taken to the operating room and general anesthesia was induced.  The patient was placed in the dorsal lithotomy position, prepped and draped in the usual sterile fashion, and preoperative antibiotics(Ancef) were administered.  SCDs were placed for DVT prophylaxis.  A preoperative time-out was performed.  ? ?Sissy Hoff  sounds were used to gently dilated the urethra up to 69F. The 92 French continuous flow resectoscope was inserted into the urethra using the visual obturator  The prostate was large with obstructing lateral lobes. The bladder was thoroughly inspected and notable for moderate trabeculations and numerous bladder stones, the largest measuring approximately 3 cm.  The ureteral orifices were located in orthotopic position.  The laser was set to 2 J and 20 Hz and used to methodically fragment all stones to smaller pieces.  This took at least 45 minutes secondary to the number of stones and size.  All stones were fragmented and evacuated free from the bladder. ? ?The laser was set to 2 J and 60 Hz and was used to make a lambda incision just proximal to the verumontanum down to the level of the capsule.  A circumferential incision was made with the laser just proximal to the sphincter.  A 6 o'clock incision was then made down to the level of the capsule from the bladder neck to the verumontanum.  The lateral lobes were then incised circumferentially until they were disconnected from the surrounding tissue.  The capsule was examined and laser was used for meticulous hemostasis.   ? ?The 60 French resectoscope was then switched out for the 26 French nephroscope and the lobes were morcellated and the tissue sent to pathology.  A 24 French three-way catheter was inserted easily with the aid of a catheter guide, and 60 cc were placed in the balloon.  Urine was pink.  The catheter irrigated easily with a Toomey syringe.  CBI was initiated. A belladonna suppository was placed. ? ?The patient tolerated the procedure well without any immediate complications and was extubated and transferred to the recovery room in stable condition.  Urine was clear on fast CBI. ? ?Disposition: Stable to  PACU ? ?Plan: ?Wean CBI in PACU, anticipate discharge home today with Foley removal in clinic in 2-3 days ? ?Legrand Rams, MD ?05/21/2021 ? ?

## 2021-05-21 NOTE — H&P (Signed)
? ?  05/21/21 ?8:04 AM  ? ?Jeneen Rinks Herrman ?05-19-1945 ?[id number] ? ?CC: BPH, bladder stones ? ?HPI: ?76 year old male who presented with gross hematuria and urinary symptoms A CT had already been performed which showed numerous bladder stones, and enlarged prostate at least 100 g.  IPSS score was 19, and he had urinary symptoms of weak stream, urgency, frequency, and feeling of incomplete emptying.  PSA in February 2020 was 4.1 which was normal for his age. ?  ? ? ? ?Surgical History: ?Past Surgical History:  ?Procedure Laterality Date  ? BACK SURGERY    ? ? ?Social History:  reports that he has never smoked. He has never been exposed to tobacco smoke. He has never used smokeless tobacco. He reports current alcohol use. He reports that he does not use drugs. ? ?Physical Exam: ?BP (!) 164/85   Pulse 63   Temp (!) 97 ?F (36.1 ?C) (Tympanic)   Ht 6' (1.829 m)   Wt 79.4 kg   SpO2 96%   BMI 23.73 kg/m?   ? ?Constitutional:  Alert and oriented, No acute distress. ?Cardiovascular: Regular rate and rhythm ?Respiratory: Clear to auscultation bilaterally ?GI: Abdomen is soft, nontender, nondistended, no abdominal masses ? ?Laboratory Data: ?UA 04/25/2021 benign ? ?Assessment & Plan:   ?76 year old male with BPH and obstructive urinary symptoms as well as multiple bladder stones and gross hematuria.  We discussed possible need for additional procedures pending cystoscopy findings with his gross hematuria. ? ?I recommended cystolitholapaxy with simultaneous outlet procedure with HOLEP. We discussed the risks and benefits of HoLEP at length.  The procedure requires general anesthesia and takes 1 to 2 hours, and a holmium laser is used to enucleate the prostate and push this tissue into the bladder.  A morcellator is then used to remove this tissue, which is sent for pathology.  The vast majority(>95%) of patients are able to discharge the same day with a catheter in place for 2 to 3 days, and will follow-up in clinic for a  voiding trial.  We specifically discussed the risks of bleeding, infection, retrograde ejaculation, temporary urgency and urge incontinence, very low risk of long-term incontinence, urethral stricture/bladder neck contracture, pathologic evaluation of prostate tissue and possible detection of prostate cancer or other malignancy, and possible need for additional procedures.  We discussed the potential of finding bladder tumor or other etiology of gross hematuria that could require additional procedures at the time of surgery. ? ?Cystolitholapaxy and HOLEP today ? ?Nickolas Madrid, MD ?05/21/2021 ? ?[facility] ?[address] ?[place], [place] [address] ?[phone] ? ? ?

## 2021-05-22 ENCOUNTER — Encounter: Payer: Self-pay | Admitting: Urology

## 2021-05-22 DIAGNOSIS — M170 Bilateral primary osteoarthritis of knee: Secondary | ICD-10-CM | POA: Insufficient documentation

## 2021-05-22 DIAGNOSIS — H9190 Unspecified hearing loss, unspecified ear: Secondary | ICD-10-CM | POA: Insufficient documentation

## 2021-05-22 DIAGNOSIS — H9319 Tinnitus, unspecified ear: Secondary | ICD-10-CM | POA: Insufficient documentation

## 2021-05-22 DIAGNOSIS — E785 Hyperlipidemia, unspecified: Secondary | ICD-10-CM | POA: Insufficient documentation

## 2021-05-22 NOTE — Anesthesia Postprocedure Evaluation (Signed)
Anesthesia Post Note ? ?Patient: Keilen Friske ? ?Procedure(s) Performed: CYSTOSCOPY WITH LITHOLAPAXY ?HOLEP-LASER ENUCLEATION OF THE PROSTATE WITH MORCELLATION ? ?Patient location during evaluation: PACU ?Anesthesia Type: General ?Level of consciousness: awake and alert ?Pain management: pain level controlled ?Vital Signs Assessment: post-procedure vital signs reviewed and stable ?Respiratory status: spontaneous breathing, nonlabored ventilation, respiratory function stable and patient connected to nasal cannula oxygen ?Cardiovascular status: blood pressure returned to baseline and stable ?Postop Assessment: no apparent nausea or vomiting ?Anesthetic complications: no ? ? ?No notable events documented. ? ? ?Last Vitals:  ?Vitals:  ? 05/21/21 1200 05/21/21 1212  ?BP: 140/85 (!) 154/97  ?Pulse: 83 77  ?Resp: 17 18  ?Temp:  36.4 ?C  ?SpO2: 94% 98%  ?  ?Last Pain:  ?Vitals:  ? 05/22/21 0915  ?TempSrc:   ?PainSc: 2   ? ? ?  ?  ?  ?  ?  ?  ? ?Lenard Simmer ? ? ? ? ?

## 2021-05-22 NOTE — Progress Notes (Signed)
Urological history:  ?1.  High risk hematuria ?-Non-smoker ?-CTU 03/2021 -marked prostatomegaly, bladder calculi and left nephrolithiasis ?-cysto 05/21/2021 -performed during HoLEP noted large prostate with obstructing lateral lobes, moderate trabeculations and numerous bladder stones ? ?2. BPH with LU TS ?-PSA 4.07 in 2020 ?-s/p HoLEP 05/21/2021 - prostate chips pathology pending  ? ?3. Bladder stones ?-s/p cystolitholapaxy 05/21/2021 ? ? ?Catheter Removal ? ?Patient is present today for a catheter removal.  60 ml of water was drained from the balloon. A 24 FR three-way foley cath was removed from the bladder no complications were noted . Patient tolerated well. ? ?Performed by: Reviewed: ?Congratulations on your recent HOLEP procedure! As discussed in clinic today, these are the three normal postoperative findings after this surgery: ?Burning or pain with urination: This typically resolves within 1 week of surgery. If you are still having significant pain with urination 10 days after surgery, please call our clinic. We may need to check you for a urinary tract infection at that point, though this is rare. ?Blood in the urine: This may either come and go or steadily improve before going away, but typically resolves completely within 3 weeks of surgery. If you are on blood thinners, it may take longer for the bleeding to resolve. Please note that you may find that you pass clumps of tissue or debris around 10-14 days after surgery associated with some new bleeding. This is due to sloughing of your postoperative scab and is also normal. If at any point you start to pass dark red urine; thick, ketchup-like urine; or large blood clots around the size of your palm, please call our office immediately. ?Urinary leakage or urgency: This tends to improve with time, with most patients becoming dry within around 3 months of surgery. You may wear absorbant underwear or liners for security during this time. To help you get dry  faster, please make sure you are completing your Kegel exercises as instructed, with a set of 10 exercises completed up to three times daily.  ? ?Follow up/ Additional notes: Keep follow up 08/16/2021 ?

## 2021-05-23 ENCOUNTER — Ambulatory Visit (INDEPENDENT_AMBULATORY_CARE_PROVIDER_SITE_OTHER): Payer: Medicare Other | Admitting: Urology

## 2021-05-23 DIAGNOSIS — N401 Enlarged prostate with lower urinary tract symptoms: Secondary | ICD-10-CM

## 2021-05-23 DIAGNOSIS — N138 Other obstructive and reflux uropathy: Secondary | ICD-10-CM

## 2021-05-23 LAB — SURGICAL PATHOLOGY

## 2021-05-23 NOTE — Patient Instructions (Addendum)
Congratulations on your recent HOLEP procedure! As discussed in clinic today, these are the three normal postoperative findings after this surgery: Burning or pain with urination: This typically resolves within 1 week of surgery. If you are still having significant pain with urination 10 days after surgery, please call our clinic. We may need to check you for a urinary tract infection at that point, though this is rare. Blood in the urine: This may either come and go or steadily improve before going away, but typically resolves completely within 3 weeks of surgery. If you are on blood thinners, it may take longer for the bleeding to resolve. Please note that you may find that you pass clumps of tissue or debris around 10-14 days after surgery associated with some new bleeding. This is due to sloughing of your postoperative scab and is also normal. If at any point you start to pass dark red urine; thick, ketchup-like urine; or large blood clots around the size of your palm, please call our office immediately. Urinary leakage or urgency: This tends to improve with time, with most patients becoming dry within around 3 months of surgery. You may wear absorbant underwear or liners for security during this time. To help you get dry faster, please make sure you are completing your Kegel exercises as instructed, with a set of 10 exercises completed up to three times daily.  

## 2021-06-18 ENCOUNTER — Telehealth: Payer: Self-pay

## 2021-06-18 NOTE — Telephone Encounter (Signed)
Pt calls triage line and states he is concerned that he is still seeing blood in his urine intermittently. Pt denies dysuria, fever, chills, or any pain. He states that the there are no clots, and urine is sometimes pink, and some times a little darker but always clear enough to see through. He states he mainly seeing the blood in the first morning void. Pt given lots of verbal reassurance on normal post HoLEP findings. Encouraged hydration. Pt voiced understanding.

## 2021-08-16 ENCOUNTER — Ambulatory Visit (INDEPENDENT_AMBULATORY_CARE_PROVIDER_SITE_OTHER): Payer: Medicare Other | Admitting: Urology

## 2021-08-16 ENCOUNTER — Encounter: Payer: Self-pay | Admitting: Urology

## 2021-08-16 VITALS — BP 167/89 | HR 75 | Ht 72.0 in | Wt 180.5 lb

## 2021-08-16 DIAGNOSIS — N138 Other obstructive and reflux uropathy: Secondary | ICD-10-CM | POA: Diagnosis not present

## 2021-08-16 DIAGNOSIS — N401 Enlarged prostate with lower urinary tract symptoms: Secondary | ICD-10-CM

## 2021-08-16 DIAGNOSIS — N529 Male erectile dysfunction, unspecified: Secondary | ICD-10-CM | POA: Diagnosis not present

## 2021-08-16 LAB — BLADDER SCAN AMB NON-IMAGING

## 2021-08-16 MED ORDER — TADALAFIL 10 MG PO TABS
10.0000 mg | ORAL_TABLET | Freq: Every day | ORAL | 3 refills | Status: DC | PRN
Start: 2021-08-16 — End: 2022-03-13

## 2021-08-16 NOTE — Patient Instructions (Signed)
Kegel Exercises  Kegel exercises can help strengthen your pelvic floor muscles. The pelvic floor is a group of muscles that support your rectum, small intestine, and bladder. In females, pelvic floor muscles also help support the uterus. These muscles help you control the flow of urine and stool (feces). Kegel exercises are painless and simple. They do not require any equipment. Your provider may suggest Kegel exercises to: Improve bladder and bowel control. Improve sexual response. Improve weak pelvic floor muscles after surgery to remove the uterus (hysterectomy) or after pregnancy, in females. Improve weak pelvic floor muscles after prostate gland removal or surgery, in males. Kegel exercises involve squeezing your pelvic floor muscles. These are the same muscles you squeeze when you try to stop the flow of urine or keep from passing gas. The exercises can be done while sitting, standing, or lying down, but it is best to vary your position. Ask your health care provider which exercises are safe for you. Do exercises exactly as told by your health care provider and adjust them as directed. Do not begin these exercises until told by your health care provider. Exercises How to do Kegel exercises: Squeeze your pelvic floor muscles tight. You should feel a tight lift in your rectal area. If you are a male, you should also feel a tightness in your vaginal area. Keep your stomach, buttocks, and legs relaxed. Hold the muscles tight for up to 10 seconds. Breathe normally. Relax your muscles for up to 10 seconds. Repeat as told by your health care provider. Repeat this exercise daily as told by your health care provider. Continue to do this exercise for at least 4-6 weeks, or for as long as told by your health care provider. You may be referred to a physical therapist who can help you learn more about how to do Kegel exercises. Depending on your condition, your health care provider may  recommend: Varying how long you squeeze your muscles. Doing several sets of exercises every day. Doing exercises for several weeks. Making Kegel exercises a part of your regular exercise routine. This information is not intended to replace advice given to you by your health care provider. Make sure you discuss any questions you have with your health care provider. Document Revised: 05/25/2020 Document Reviewed: 05/25/2020 Elsevier Patient Education  2023 [employer]  

## 2021-08-16 NOTE — Progress Notes (Signed)
   08/16/2021 2:20 PM   Rodney Marshall 07/06/1945 [id number]  Reason for visit: Follow up BPH status post HOLEP, ED  HPI: 76 year old male who previously presented with recurrent gross hematuria felt to be from BPH and numerous bladder stones, as well as obstructive urinary symptoms including weak stream, urgency/frequency, and sensation of incomplete emptying.  He underwent uncomplicated HOLEP with cystolitholapaxy of numerous bladder stones, pathology showed only benign prostate tissue.  He has been doing well since that time and is urinating with a strong stream.  Gross hematuria has resolved.  He still having some very mild stress incontinence, and wearing a pad primarily just for safety.  We reviewed Kegel exercises.  Reassurance provided regarding expected postoperative course.  He also reports problems with erections from prior to surgery, has not attempted any sexual activity since surgery.  He previously was on Viagra, but wonders if there is any other options.  We reviewed the risk benefits of Cialis, and that the extended half-life can make this a better option for many patients with ED.  Trial of Cialis 10 to 20 mg as needed for ED Discussed Kegel exercises RTC 6 months PVR  Sondra Come, MD  [facility] [address] [place], [place] [address] [phone]

## 2022-02-20 ENCOUNTER — Ambulatory Visit: Payer: Medicare Other | Admitting: Urology

## 2022-03-13 ENCOUNTER — Ambulatory Visit (INDEPENDENT_AMBULATORY_CARE_PROVIDER_SITE_OTHER): Payer: Medicare Other | Admitting: Urology

## 2022-03-13 ENCOUNTER — Encounter: Payer: Self-pay | Admitting: Urology

## 2022-03-13 VITALS — BP 156/82 | HR 65 | Ht 72.0 in | Wt 180.0 lb

## 2022-03-13 DIAGNOSIS — N529 Male erectile dysfunction, unspecified: Secondary | ICD-10-CM | POA: Diagnosis not present

## 2022-03-13 DIAGNOSIS — N138 Other obstructive and reflux uropathy: Secondary | ICD-10-CM | POA: Diagnosis not present

## 2022-03-13 DIAGNOSIS — N401 Enlarged prostate with lower urinary tract symptoms: Secondary | ICD-10-CM | POA: Diagnosis not present

## 2022-03-13 LAB — BLADDER SCAN AMB NON-IMAGING

## 2022-03-13 MED ORDER — TADALAFIL 20 MG PO TABS: 20.0000 mg | ORAL_TABLET | Freq: Every day | ORAL | 6 refills | Status: DC | PRN

## 2022-03-13 NOTE — Progress Notes (Signed)
   03/13/2022 2:23 PM   Rodney Marshall 10-Oct-1945 [id number]  Reason for visit: Follow up BPH status post HOLEP, ED  HPI: 77 year old male who previously presented with recurrent gross hematuria felt to be from BPH and numerous bladder stones, as well as obstructive urinary symptoms including weak stream, urgency/frequency, and sensation of incomplete emptying.  He underwent uncomplicated HOLEP with cystolitholapaxy of numerous bladder stones, pathology showed only benign prostate tissue.  He has been doing well since that time and is urinating with a strong stream.  Gross hematuria has resolved.  He denies any incontinence.  He has some occasional urgency if he tries to hold it for too long.  He also has moderate ED, but that has been responsive to 10-20 mg Cialis on demand over the last year.  He would like to increase to the max 20 mg dose tablets.  Risk benefits discussed again.  Cialis increased to 20 mg as needed RTC 1 year PVR and symptom check, can likely follow-up with PCP at that time if doing well   [place], [place] [address], [place], [place] [address] [phone]

## 2023-02-11 ENCOUNTER — Other Ambulatory Visit: Payer: Self-pay | Admitting: Orthopedic Surgery

## 2023-02-20 ENCOUNTER — Encounter
Admission: RE | Admit: 2023-02-20 | Discharge: 2023-02-20 | Disposition: A | Discharge status: Home / Self Care | Payer: Medicare Other | Source: Ambulatory Visit | Attending: Orthopedic Surgery | Admitting: Orthopedic Surgery

## 2023-02-20 ENCOUNTER — Other Ambulatory Visit: Payer: Self-pay

## 2023-02-20 VITALS — BP 148/72 | HR 73 | Temp 97.3°F | Resp 20 | Ht 72.0 in | Wt 180.1 lb

## 2023-02-20 DIAGNOSIS — M170 Bilateral primary osteoarthritis of knee: Secondary | ICD-10-CM | POA: Insufficient documentation

## 2023-02-20 DIAGNOSIS — Z01810 Encounter for preprocedural cardiovascular examination: Secondary | ICD-10-CM | POA: Diagnosis not present

## 2023-02-20 DIAGNOSIS — Z01818 Encounter for other preprocedural examination: Secondary | ICD-10-CM | POA: Insufficient documentation

## 2023-02-20 DIAGNOSIS — Z01812 Encounter for preprocedural laboratory examination: Secondary | ICD-10-CM

## 2023-02-20 HISTORY — DX: Essential (primary) hypertension: I10

## 2023-02-20 HISTORY — DX: Dizziness and giddiness: R42

## 2023-02-20 LAB — COMPREHENSIVE METABOLIC PANEL
ALT: 22 U/L (ref 0–44)
AST: 27 U/L (ref 15–41)
Albumin: 3.8 g/dL (ref 3.5–5.0)
Alkaline Phosphatase: 62 U/L (ref 38–126)
Anion gap: 12 (ref 5–15)
BUN: 22 mg/dL (ref 8–23)
CO2: 23 mmol/L (ref 22–32)
Calcium: 9.5 mg/dL (ref 8.9–10.3)
Chloride: 103 mmol/L (ref 98–111)
Creatinine, Ser: 0.95 mg/dL (ref 0.61–1.24)
GFR, Estimated: >60 mL/min (ref >60)
Glucose, Bld: 83 mg/dL (ref 70–99)
Potassium: 3.8 mmol/L (ref 3.5–5.1)
Sodium: 138 mmol/L (ref 135–145)
Total Bilirubin: 0.9 mg/dL (ref 0.0–1.2)
Total Protein: 6.8 g/dL (ref 6.5–8.1)

## 2023-02-20 LAB — CBC WITH DIFFERENTIAL/PLATELET
Abs Immature Granulocytes: 0.05 10*3/uL (ref 0.00–0.07)
Basophils Absolute: 0.1 10*3/uL (ref 0.0–0.1)
Basophils Relative: 1 %
Eosinophils Absolute: 0.2 10*3/uL (ref 0.0–0.5)
Eosinophils Relative: 2 %
HCT: 49.3 % (ref 39.0–52.0)
Hemoglobin: 17 g/dL (ref 13.0–17.0)
Immature Granulocytes: 1 %
Lymphocytes Relative: 11 %
Lymphs Abs: 0.9 10*3/uL (ref 0.7–4.0)
MCH: 31.3 pg (ref 26.0–34.0)
MCHC: 34.5 g/dL (ref 30.0–36.0)
MCV: 90.6 fL (ref 80.0–100.0)
Monocytes Absolute: 0.9 10*3/uL (ref 0.1–1.0)
Monocytes Relative: 10 %
Neutro Abs: 6.3 10*3/uL (ref 1.7–7.7)
Neutrophils Relative %: 75 %
Platelets: 308 10*3/uL (ref 150–400)
RBC: 5.44 MIL/uL (ref 4.22–5.81)
RDW: 12.9 % (ref 11.5–15.5)
WBC: 8.4 10*3/uL (ref 4.0–10.5)
nRBC: 0 % (ref 0.0–0.2)

## 2023-02-20 LAB — SURGICAL PCR SCREEN
MRSA, PCR: NEGATIVE
Staphylococcus aureus: NEGATIVE

## 2023-02-20 LAB — URINALYSIS, ROUTINE W REFLEX MICROSCOPIC
Bilirubin Urine: NEGATIVE
Glucose, UA: NEGATIVE mg/dL
Hgb urine dipstick: NEGATIVE
Ketones, ur: NEGATIVE mg/dL
Leukocytes,Ua: NEGATIVE
Nitrite: NEGATIVE
Protein, ur: NEGATIVE mg/dL
Specific Gravity, Urine: 1.023 (ref 1.005–1.030)
pH: 5 (ref 5.0–8.0)

## 2023-02-20 NOTE — Patient Instructions (Addendum)
Your procedure is scheduled on: Jan 27/2025 Monday  Report to the Registration Desk on the 1st floor of the [facility] To find out your arrival time, please call [phone] between 1PM - 3PM on:  Jan 24.2025 Friday. If your arrival time is 6:00 am, do not arrive before that time as the [facility] entrance doors do not open until 6:00 am.  REMEMBER: Instructions that are not followed completely may result in serious medical risk, up to and including death; or upon the discretion of your surgeon and anesthesiologist your surgery may need to be rescheduled.  Do not eat food after midnight the night before surgery.  No gum chewing or hard candies.  You may however, drink CLEAR liquids up to 2 hours before you are scheduled to arrive for your surgery. Do not drink anything within 2 hours of your scheduled arrival time.  Clear liquids include: - water  - apple juice without pulp - gatorade (not RED colors) - black coffee or tea (Do NOT add milk or creamers to the coffee or tea) Do NOT drink anything that is not on this list.   In addition, your doctor has ordered for you to drink the provided:  Ensure Pre-Surgery Clear Carbohydrate Drink   Drinking this carbohydrate drink up to two hours before surgery helps to reduce insulin resistance and improve patient outcomes. Please complete drinking 2 hours before scheduled arrival time.  One week prior to surgery: Stop Anti-inflammatories (NSAIDS) such as Advil, Aleve, Ibuprofen, Motrin, Naproxen, Naprosyn and Aspirin based products such as Excedrin, Goody's Powder, BC Powder. Stop ANY OVER THE COUNTER supplements until after surgery.  You may however, continue to take Tylenol if needed for pain up until the day of surgery.   Continue taking all of your other prescription medications up until the day of surgery.  ON THE DAY OF SURGERY ONLY TAKE THESE MEDICATIONS WITH SIPS OF WATER:  ezetimibe (ZETIA)   No Alcohol for 24 hours  before or after surgery.  No Smoking including e-cigarettes for 24 hours before surgery.  No chewable tobacco products for at least 6 hours before surgery.  No nicotine patches on the day of surgery.  Do not use any "recreational" drugs for at least a week (preferably 2 weeks) before your surgery.  Please be advised that the combination of cocaine and anesthesia may have negative outcomes, up to and including death. If you test positive for cocaine, your surgery will be cancelled.  On the morning of surgery brush your teeth with toothpaste and water, you may rinse your mouth with mouthwash if you wish.  Do not swallow any toothpaste or mouthwash.  Use CHG Soap or wipes as directed on instruction sheet.-provided for you   Do not wear jewelry, make-up, hairpins, clips or nail polish.  For welded (permanent) jewelry: bracelets, anklets, waist bands, etc.  Please have this removed prior to surgery.  If it is not removed, there is a chance that hospital personnel will need to cut it off on the day of surgery.  Do not wear lotions, powders, or perfumes.   Do not shave body hair from the neck down 48 hours before surgery.  Contact lenses, hearing aids and dentures may not be worn into surgery.  Do not bring valuables to the hospital. [facility] is not responsible for any missing/lost belongings or valuables.    Notify your doctor if there is any change in your medical condition (cold, fever, infection).  Wear comfortable clothing (specific  to your surgery type) to the hospital.  After surgery, you can help prevent lung complications by doing breathing exercises.  Take deep breaths and cough every 1-2 hours. Your doctor may order a device called an Incentive Spirometer to help you take deep breaths.  If you are being admitted to the hospital overnight, leave your suitcase in the car. After surgery it may be brought to your room.  In case of increased patient census, it may be necessary  for you, the patient, to continue your postoperative care in the Same Day Surgery department.  If you are being discharged the day of surgery, you will not be allowed to drive home. You will need a responsible individual to drive you home and stay with you for 24 hours after surgery.    Please call the Pre-admissions Testing Dept. at [phone] if you have any questions about these instructions.  Surgery Visitation Policy:  Patients having surgery or a procedure may have two visitors.  Children under the age of 54 must have an adult with them who is not the patient.  Temporary Visitor Restrictions Due to increasing cases of flu, RSV and COVID-19: Children ages 40 and under will not be able to visit patients in [facility] hospitals under most circumstances.  Inpatient Visitation:    Visiting hours are 7 a.m. to 8 p.m. Up to four visitors are allowed at one time in a patient room. The visitors may rotate out with other people during the day.  One visitor age 87 or older may stay with the patient overnight and must be in the room by 8 p.m.    Pre-operative 5 CHG Bath Instructions   You can play a key role in reducing the risk of infection after surgery. Your skin needs to be as free of germs as possible. You can reduce the number of germs on your skin by washing with CHG (chlorhexidine gluconate) soap before surgery. CHG is an antiseptic soap that kills germs and continues to kill germs even after washing.   DO NOT use if you have an allergy to chlorhexidine/CHG or antibacterial soaps. If your skin becomes reddened or irritated, stop using the CHG and notify one of our RNs at [phone].   Please shower with the CHG soap starting 4 days before surgery using the following schedule:        Please keep in mind the following:  DO NOT shave, including legs and underarms, starting the day of your first shower.   You may shave your face at any point before/day of surgery.  Place  clean sheets on your bed the day you start using CHG soap. Use a clean washcloth (not used since being washed) for each shower. DO NOT sleep with pets once you start using the CHG.   CHG Shower Instructions:  If you choose to wash your hair and private area, wash first with your normal shampoo/soap.  After you use shampoo/soap, rinse your hair and body thoroughly to remove shampoo/soap residue.  Turn the water OFF and apply about 3 tablespoons (45 ml) of CHG soap to a CLEAN washcloth.  Apply CHG soap ONLY FROM YOUR NECK DOWN TO YOUR TOES (washing for 3-5 minutes)  DO NOT use CHG soap on face, private areas, open wounds, or sores.  Pay special attention to the area where your surgery is being performed.  If you are having back surgery, having someone wash your back for you may be helpful. Wait 2 minutes after CHG  soap is applied, then you may rinse off the CHG soap.  Pat dry with a clean towel  Put on clean clothes/pajamas   If you choose to wear lotion, please use ONLY the CHG-compatible lotions on the back of this paper.     Additional instructions for the day of surgery: DO NOT APPLY any lotions, deodorants, cologne, or perfumes.   Put on clean/comfortable clothes.  Brush your teeth.  Ask your nurse before applying any prescription medications to the skin.      CHG Compatible Lotions   Aveeno Moisturizing lotion  Cetaphil Moisturizing Cream  Cetaphil Moisturizing Lotion  Clairol Herbal Essence Moisturizing Lotion, Dry Skin  Clairol Herbal Essence Moisturizing Lotion, Extra Dry Skin  Clairol Herbal Essence Moisturizing Lotion, Normal Skin  Curel Age Defying Therapeutic Moisturizing Lotion with Alpha Hydroxy  Curel Extreme Care Body Lotion  Curel Soothing Hands Moisturizing Hand Lotion  Curel Therapeutic Moisturizing Cream, Fragrance-Free  Curel Therapeutic Moisturizing Lotion, Fragrance-Free  Curel Therapeutic Moisturizing Lotion, Original Formula  Eucerin Daily Replenishing  Lotion  Eucerin Dry Skin Therapy Plus Alpha Hydroxy Crme  Eucerin Dry Skin Therapy Plus Alpha Hydroxy Lotion  Eucerin Original Crme  Eucerin Original Lotion  Eucerin Plus Crme Eucerin Plus Lotion  Eucerin TriLipid Replenishing Lotion  Keri Anti-Bacterial Hand Lotion  Keri Deep Conditioning Original Lotion Dry Skin Formula Softly Scented  Keri Deep Conditioning Original Lotion, Fragrance Free Sensitive Skin Formula  Keri Lotion Fast Absorbing Fragrance Free Sensitive Skin Formula  Keri Lotion Fast Absorbing Softly Scented Dry Skin Formula  Keri Original Lotion  Keri Skin Renewal Lotion Keri Silky Smooth Lotion  Keri Silky Smooth Sensitive Skin Lotion  Nivea Body Creamy Conditioning Oil  Nivea Body Extra Enriched Lotion  Nivea Body Original Lotion  Nivea Body Sheer Moisturizing Lotion Nivea Crme  Nivea Skin Firming Lotion  NutraDerm 30 Skin Lotion  NutraDerm Skin Lotion  NutraDerm Therapeutic Skin Cream  NutraDerm Therapeutic Skin Lotion  ProShield Protective Hand Cream  Provon moisturizing lotion   Preoperative Educational Videos for Total Hip, Knee and Shoulder Replacements  To better prepare for surgery, please view our videos that explain the physical activity and discharge planning required to have the best surgical recovery at [facility].  [email or web address]  Questions? Call [phone] or email [email or web address]      How to Use an Incentive Spirometer  An incentive spirometer is a tool that measures how well you are filling your lungs with each breath. Learning to take long, deep breaths using this tool can help you keep your lungs clear and active. This may help to reverse or lessen your chance of developing breathing (pulmonary) problems, especially infection. You may be asked to use a spirometer: After a surgery. If you have a lung problem or a history of  smoking. After a long period of time when you have been unable to move or be active. If the spirometer includes an indicator to show the highest number that you have reached, your health care provider or respiratory therapist will help you set a goal. Keep a log of your progress as told by your health care provider. What are the risks? Breathing too quickly may cause dizziness or cause you to pass out. Take your time so you do not get dizzy or light-headed. If you are in pain, you may need to take pain medicine before doing incentive spirometry. It is harder to take a deep breath if you are having pain. How to use your incentive spirometer  Sit up on the edge of your bed or on a chair. Hold the incentive spirometer so that it is in an upright position. Before you use the spirometer, breathe out normally. Place the mouthpiece in your mouth. Make sure your lips are closed tightly around it. Breathe in slowly and as deeply as you can through your mouth, causing the piston or the ball to rise toward the top of the chamber. Hold your breath for 3-5 seconds, or for as long as possible. If the spirometer includes a coach indicator, use this to guide you in breathing. Slow down your breathing if the indicator goes above the marked areas. Remove the mouthpiece from your mouth and breathe out normally. The piston or ball will return to the bottom of the chamber. Rest for a few seconds, then repeat the steps 10 or more times. Take your time and take a few normal breaths between deep breaths so that you do not get dizzy or light-headed. Do this every 1-2 hours when you are awake. If the spirometer includes a goal marker to show the highest number you have reached (best effort), use this as a goal to work toward during each repetition. After each set of 10 deep breaths, cough a few times. This will help to make sure that your lungs are clear. If you have an incision on your chest or abdomen from surgery,  place a pillow or a rolled-up towel firmly against the incision when you cough. This can help to reduce pain while taking deep breaths and coughing. General tips When you are able to get out of bed: Walk around often. Continue to take deep breaths and cough in order to clear your lungs. Keep using the incentive spirometer until your health care provider says it is okay to stop using it. If you have been in the hospital, you may be told to keep using the spirometer at home. Contact a health care provider if: You are having difficulty using the spirometer. You have trouble using the spirometer as often as instructed. Your pain medicine is not giving enough relief for you to use the spirometer as told. You have a fever. Get help right away if: You develop shortness of breath. You develop a cough with bloody mucus from the lungs. You have fluid or blood coming from an incision site after you cough. Summary An incentive spirometer is a tool that can help you learn to take long, deep breaths to keep your lungs clear and active. You may be asked to use a spirometer after a surgery, if you have a lung problem or a history of smoking, or if you have been inactive for a long period of time. Use your incentive spirometer as instructed every 1-2 hours while you are awake. If you have an incision on your chest or abdomen, place a pillow or a rolled-up towel firmly against your incision when you cough. This will help to reduce pain. Get help right away if you have shortness of breath, you cough up bloody mucus, or blood comes from your incision when you cough. This information is not intended to replace advice given to you by your health care provider. Make sure you discuss any questions you have with your health care provider. Document Revised: 04/05/2019 Document Reviewed: 04/05/2019 Elsevier Patient Education  2023 [employer].

## 2023-02-23 MED ORDER — DEXAMETHASONE SODIUM PHOSPHATE 10 MG/ML IJ SOLN
8.0000 mg | Freq: Once | INTRAMUSCULAR | Status: AC
  Administered 2023-02-24: 8 mg via INTRAVENOUS

## 2023-02-23 MED ORDER — CEFAZOLIN SODIUM-DEXTROSE 2-4 GM/100ML-% IV SOLN
2.0000 g | INTRAVENOUS | Status: AC
  Administered 2023-02-24: 2 g via INTRAVENOUS

## 2023-02-23 MED ORDER — LACTATED RINGERS IV SOLN
INTRAVENOUS | Status: DC
Start: 2023-02-23 — End: 2023-02-24

## 2023-02-23 MED ORDER — ORAL CARE MOUTH RINSE: 15.0000 mL | Freq: Once | OROMUCOSAL | Status: AC

## 2023-02-23 MED ORDER — CHLORHEXIDINE GLUCONATE 0.12 % MT SOLN
15.0000 mL | Freq: Once | OROMUCOSAL | Status: AC
  Administered 2023-02-24: 15 mL via OROMUCOSAL

## 2023-02-23 MED ORDER — TRANEXAMIC ACID-NACL 1000-0.7 MG/100ML-% IV SOLN
1000.0000 mg | INTRAVENOUS | Status: AC
  Administered 2023-02-24: 1000 mg via INTRAVENOUS
  Filled 2023-02-23: qty 100

## 2023-02-24 ENCOUNTER — Ambulatory Visit: Payer: Self-pay

## 2023-02-24 ENCOUNTER — Other Ambulatory Visit: Payer: Self-pay

## 2023-02-24 ENCOUNTER — Ambulatory Visit
Admission: RE | Admit: 2023-02-24 | Discharge: 2023-02-25 | Disposition: A | Discharge status: Home Health | Payer: Medicare Other | Attending: Orthopedic Surgery | Admitting: Orthopedic Surgery

## 2023-02-24 ENCOUNTER — Ambulatory Visit: Payer: Medicare Other | Admitting: Urgent Care

## 2023-02-24 ENCOUNTER — Ambulatory Visit: Payer: Medicare Other

## 2023-02-24 ENCOUNTER — Encounter: Payer: Self-pay | Admitting: Orthopedic Surgery

## 2023-02-24 ENCOUNTER — Encounter
Admission: RE | Disposition: A | Discharge status: Home Health | Payer: Self-pay | Source: Home / Self Care | Attending: Orthopedic Surgery

## 2023-02-24 DIAGNOSIS — M1712 Unilateral primary osteoarthritis, left knee: Secondary | ICD-10-CM | POA: Diagnosis present

## 2023-02-24 DIAGNOSIS — Z471 Aftercare following joint replacement surgery: Secondary | ICD-10-CM | POA: Insufficient documentation

## 2023-02-24 DIAGNOSIS — Z96652 Presence of left artificial knee joint: Secondary | ICD-10-CM | POA: Diagnosis not present

## 2023-02-24 DIAGNOSIS — I10 Essential (primary) hypertension: Secondary | ICD-10-CM | POA: Diagnosis not present

## 2023-02-24 DIAGNOSIS — M25762 Osteophyte, left knee: Secondary | ICD-10-CM | POA: Insufficient documentation

## 2023-02-24 HISTORY — PX: TOTAL KNEE ARTHROPLASTY: SHX125

## 2023-02-24 SURGERY — ARTHROPLASTY, KNEE, TOTAL
Anesthesia: Spinal | Site: Knee | Laterality: Left

## 2023-02-24 MED ORDER — EZETIMIBE 10 MG PO TABS
10.0000 mg | ORAL_TABLET | Freq: Every morning | ORAL | Status: DC
  Administered 2023-02-25: 10 mg via ORAL

## 2023-02-24 MED ORDER — ONDANSETRON HCL 4 MG/2ML IJ SOLN
INTRAMUSCULAR | Status: DC | PRN
  Administered 2023-02-24: 4 mg via INTRAVENOUS

## 2023-02-24 MED ORDER — SURGIPHOR WOUND IRRIGATION SYSTEM - OPTIME: TOPICAL | Status: DC | PRN

## 2023-02-24 MED ORDER — MECLIZINE HCL 25 MG PO TABS: 25.0000 mg | ORAL_TABLET | Freq: Three times a day (TID) | ORAL | Status: DC | PRN

## 2023-02-24 MED ORDER — PHENYLEPHRINE HCL-NACL 20-0.9 MG/250ML-% IV SOLN
INTRAVENOUS | Status: DC | PRN
  Administered 2023-02-24: 26.667 ug/min via INTRAVENOUS

## 2023-02-24 MED ORDER — CEFAZOLIN SODIUM-DEXTROSE 2-4 GM/100ML-% IV SOLN
INTRAVENOUS | Status: AC
  Filled 2023-02-24: qty 100

## 2023-02-24 MED ORDER — DOCUSATE SODIUM 100 MG PO CAPS
ORAL_CAPSULE | ORAL | Status: AC
  Filled 2023-02-24: qty 1

## 2023-02-24 MED ORDER — SODIUM CHLORIDE 0.9 % IV SOLN
INTRAVENOUS | Status: DC
Start: 2023-02-24 — End: 2023-02-25

## 2023-02-24 MED ORDER — BUPIVACAINE HCL (PF) 0.5 % IJ SOLN
INTRAMUSCULAR | Status: DC | PRN
  Administered 2023-02-24: 2.8 mL

## 2023-02-24 MED ORDER — CEFAZOLIN SODIUM-DEXTROSE 2-4 GM/100ML-% IV SOLN
2.0000 g | Freq: Four times a day (QID) | INTRAVENOUS | Status: AC
  Administered 2023-02-24 – 2023-02-25 (×2): 2 g via INTRAVENOUS

## 2023-02-24 MED ORDER — MIDAZOLAM HCL 2 MG/2ML IJ SOLN
INTRAMUSCULAR | Status: AC
  Filled 2023-02-24: qty 2

## 2023-02-24 MED ORDER — PROPOFOL 1000 MG/100ML IV EMUL
INTRAVENOUS | Status: AC
  Filled 2023-02-24: qty 100

## 2023-02-24 MED ORDER — FENTANYL CITRATE (PF) 100 MCG/2ML IJ SOLN: 25.0000 ug | INTRAMUSCULAR | Status: DC | PRN

## 2023-02-24 MED ORDER — PANTOPRAZOLE SODIUM 40 MG PO TBEC
40.0000 mg | DELAYED_RELEASE_TABLET | Freq: Every day | ORAL | Status: DC
  Administered 2023-02-24 – 2023-02-25 (×2): 40 mg via ORAL

## 2023-02-24 MED ORDER — PHENOL 1.4 % MT LIQD: 1.0000 | OROMUCOSAL | Status: DC | PRN

## 2023-02-24 MED ORDER — OXYCODONE HCL 5 MG PO TABS
5.0000 mg | ORAL_TABLET | Freq: Once | ORAL | Status: DC | PRN
Start: 2023-02-24 — End: 2023-02-24

## 2023-02-24 MED ORDER — ACETAMINOPHEN 10 MG/ML IV SOLN
INTRAVENOUS | Status: DC | PRN
  Administered 2023-02-24 (×2): 1000 mg via INTRAVENOUS

## 2023-02-24 MED ORDER — PROPOFOL 500 MG/50ML IV EMUL
INTRAVENOUS | Status: DC | PRN
  Administered 2023-02-24: 150 ug/kg/min via INTRAVENOUS

## 2023-02-24 MED ORDER — ACETAMINOPHEN 500 MG PO TABS
ORAL_TABLET | ORAL | Status: AC
Start: 2023-02-24 — End: ?
  Filled 2023-02-24: qty 2

## 2023-02-24 MED ORDER — PANTOPRAZOLE SODIUM 40 MG PO TBEC
DELAYED_RELEASE_TABLET | ORAL | Status: AC
  Filled 2023-02-24: qty 1

## 2023-02-24 MED ORDER — ONDANSETRON HCL 4 MG/2ML IJ SOLN
INTRAMUSCULAR | Status: AC
  Filled 2023-02-24: qty 2

## 2023-02-24 MED ORDER — SODIUM CHLORIDE 0.9 % IR SOLN
Status: DC | PRN
  Administered 2023-02-24: 2000 mL

## 2023-02-24 MED ORDER — EPHEDRINE 5 MG/ML INJ
INTRAVENOUS | Status: AC
  Filled 2023-02-24: qty 5

## 2023-02-24 MED ORDER — KETOROLAC TROMETHAMINE 30 MG/ML IJ SOLN
INTRAMUSCULAR | Status: AC
  Filled 2023-02-24: qty 1

## 2023-02-24 MED ORDER — SODIUM CHLORIDE (PF) 0.9 % IJ SOLN
INTRAMUSCULAR | Status: DC | PRN
  Administered 2023-02-24: 71 mL

## 2023-02-24 MED ORDER — ONDANSETRON HCL 4 MG PO TABS: 4.0000 mg | ORAL_TABLET | Freq: Four times a day (QID) | ORAL | Status: DC | PRN

## 2023-02-24 MED ORDER — ENOXAPARIN SODIUM 30 MG/0.3ML IJ SOSY
30.0000 mg | PREFILLED_SYRINGE | Freq: Two times a day (BID) | INTRAMUSCULAR | Status: DC
  Administered 2023-02-25: 30 mg via SUBCUTANEOUS

## 2023-02-24 MED ORDER — KETOROLAC TROMETHAMINE 15 MG/ML IJ SOLN
INTRAMUSCULAR | Status: AC
  Filled 2023-02-24: qty 1

## 2023-02-24 MED ORDER — DEXAMETHASONE SODIUM PHOSPHATE 10 MG/ML IJ SOLN
INTRAMUSCULAR | Status: AC
  Filled 2023-02-24: qty 1

## 2023-02-24 MED ORDER — MENTHOL 3 MG MT LOZG: 1.0000 | LOZENGE | OROMUCOSAL | Status: DC | PRN

## 2023-02-24 MED ORDER — TRAMADOL HCL 50 MG PO TABS
50.0000 mg | ORAL_TABLET | Freq: Four times a day (QID) | ORAL | Status: DC | PRN
  Administered 2023-02-25: 50 mg via ORAL

## 2023-02-24 MED ORDER — ACETAMINOPHEN 500 MG PO TABS
1000.0000 mg | ORAL_TABLET | Freq: Three times a day (TID) | ORAL | Status: DC
  Administered 2023-02-24: 1000 mg via ORAL
  Administered 2023-02-25: 500 mg via ORAL

## 2023-02-24 MED ORDER — KETOROLAC TROMETHAMINE 15 MG/ML IJ SOLN
7.5000 mg | Freq: Four times a day (QID) | INTRAMUSCULAR | Status: DC
  Administered 2023-02-24 – 2023-02-25 (×3): 7.5 mg via INTRAVENOUS

## 2023-02-24 MED ORDER — DOCUSATE SODIUM 100 MG PO CAPS
100.0000 mg | ORAL_CAPSULE | Freq: Two times a day (BID) | ORAL | Status: DC
Start: 2023-02-24 — End: 2023-02-25
  Administered 2023-02-24 – 2023-02-25 (×2): 100 mg via ORAL

## 2023-02-24 MED ORDER — MIDAZOLAM HCL 5 MG/5ML IJ SOLN
INTRAMUSCULAR | Status: DC | PRN
  Administered 2023-02-24: 2 mg via INTRAVENOUS

## 2023-02-24 MED ORDER — ORAL CARE MOUTH RINSE: 15.0000 mL | OROMUCOSAL | Status: DC | PRN

## 2023-02-24 MED ORDER — EPHEDRINE SULFATE-NACL 50-0.9 MG/10ML-% IV SOSY
PREFILLED_SYRINGE | INTRAVENOUS | Status: DC | PRN
  Administered 2023-02-24: 5 mg via INTRAVENOUS

## 2023-02-24 MED ORDER — OXYCODONE HCL 5 MG/5ML PO SOLN: 5.0000 mg | Freq: Once | ORAL | Status: DC | PRN

## 2023-02-24 MED ORDER — ONDANSETRON HCL 4 MG/2ML IJ SOLN: 4.0000 mg | Freq: Four times a day (QID) | INTRAMUSCULAR | Status: DC | PRN

## 2023-02-24 MED ORDER — KETOROLAC TROMETHAMINE 15 MG/ML IJ SOLN
INTRAMUSCULAR | Status: AC
Start: 2023-02-24 — End: ?
  Filled 2023-02-24: qty 1

## 2023-02-24 MED ORDER — MORPHINE SULFATE (PF) 4 MG/ML IV SOLN: 0.5000 mg | INTRAVENOUS | Status: DC | PRN

## 2023-02-24 MED ORDER — ACETAMINOPHEN 10 MG/ML IV SOLN: 1000.0000 mg | Freq: Once | INTRAVENOUS | Status: DC | PRN

## 2023-02-24 MED ORDER — LIDOCAINE HCL (PF) 2 % IJ SOLN
INTRAMUSCULAR | Status: AC
  Filled 2023-02-24: qty 5

## 2023-02-24 MED ORDER — TRANEXAMIC ACID-NACL 1000-0.7 MG/100ML-% IV SOLN
INTRAVENOUS | Status: AC
  Filled 2023-02-24: qty 100

## 2023-02-24 MED ORDER — ACETAMINOPHEN 325 MG PO TABS: 325.0000 mg | ORAL_TABLET | Freq: Four times a day (QID) | ORAL | Status: DC | PRN

## 2023-02-24 MED ORDER — HYDROCHLOROTHIAZIDE 25 MG PO TABS: 12.5000 mg | ORAL_TABLET | Freq: Every day | ORAL | Status: DC

## 2023-02-24 MED ORDER — BUPIVACAINE LIPOSOME 1.3 % IJ SUSP
INTRAMUSCULAR | Status: AC
  Filled 2023-02-24: qty 20

## 2023-02-24 MED ORDER — METOCLOPRAMIDE HCL 5 MG/ML IJ SOLN: 5.0000 mg | Freq: Three times a day (TID) | INTRAMUSCULAR | Status: DC | PRN

## 2023-02-24 MED ORDER — LIDOCAINE HCL (CARDIAC) PF 100 MG/5ML IV SOSY
PREFILLED_SYRINGE | INTRAVENOUS | Status: DC | PRN
  Administered 2023-02-24: 60 mg via INTRAVENOUS

## 2023-02-24 MED ORDER — DROPERIDOL 2.5 MG/ML IJ SOLN: 0.6250 mg | Freq: Once | INTRAMUSCULAR | Status: DC | PRN

## 2023-02-24 MED ORDER — SODIUM CHLORIDE FLUSH 0.9 % IV SOLN
INTRAVENOUS | Status: AC
  Filled 2023-02-24: qty 20

## 2023-02-24 MED ORDER — HYDROCODONE-ACETAMINOPHEN 5-325 MG PO TABS
1.0000 | ORAL_TABLET | ORAL | Status: DC | PRN
Start: 2023-02-24 — End: 2023-02-25
  Administered 2023-02-25: 1 via ORAL

## 2023-02-24 MED ORDER — CHLORHEXIDINE GLUCONATE 0.12 % MT SOLN
OROMUCOSAL | Status: AC
  Filled 2023-02-24: qty 15

## 2023-02-24 MED ORDER — BUPIVACAINE-EPINEPHRINE (PF) 0.25% -1:200000 IJ SOLN
INTRAMUSCULAR | Status: AC
  Filled 2023-02-24: qty 30

## 2023-02-24 MED ORDER — METOCLOPRAMIDE HCL 5 MG PO TABS: 5.0000 mg | ORAL_TABLET | Freq: Three times a day (TID) | ORAL | Status: DC | PRN

## 2023-02-24 MED ORDER — ACETAMINOPHEN 10 MG/ML IV SOLN
INTRAVENOUS | Status: AC
  Filled 2023-02-24: qty 100

## 2023-02-24 MED ORDER — FENTANYL CITRATE (PF) 100 MCG/2ML IJ SOLN
INTRAMUSCULAR | Status: AC
  Filled 2023-02-24: qty 2

## 2023-02-24 SURGICAL SUPPLY — 69 items
BLADE PATELLA W-PILOT HOLE 35 (BLADE) IMPLANT
BLADE SAGITTAL AGGR TOOTH XLG (BLADE) IMPLANT
BLADE SAW SAG 25X90X1.19 (BLADE) ×1 IMPLANT
BLADE SAW SAG 29X58X.64 (BLADE) ×1 IMPLANT
BNDG ELASTIC 6INX 5YD STR LF (GAUZE/BANDAGES/DRESSINGS) ×1 IMPLANT
BOWL CEMENT MIX W/ADAPTER (MISCELLANEOUS) ×1 IMPLANT
BRUSH SCRUB EZ PLAIN DRY (MISCELLANEOUS) ×1 IMPLANT
CEMENT BONE R 1X40 (Cement) ×2 IMPLANT
CHLORAPREP W/TINT 26 (MISCELLANEOUS) ×2 IMPLANT
COMP FEM CMT PERS SZ 11 LT (Joint) ×1 IMPLANT
COMPONENT FEM CMT PERS SZ11LT (Joint) IMPLANT
COOLER POLAR GLACIER W/PUMP (MISCELLANEOUS) ×1 IMPLANT
CUFF TRNQT CYL 24X4X16.5-23 (TOURNIQUET CUFF) IMPLANT
CUFF TRNQT CYL 30X4X21-28X (TOURNIQUET CUFF) IMPLANT
DERMABOND ADVANCED .7 DNX12 (GAUZE/BANDAGES/DRESSINGS) ×1 IMPLANT
DRAPE SHEET LG 3/4 BI-LAMINATE (DRAPES) ×1 IMPLANT
DRSG MEPILEX SACRM 8.7X9.8 (GAUZE/BANDAGES/DRESSINGS) ×1 IMPLANT
DRSG OPSITE POSTOP 4X10 (GAUZE/BANDAGES/DRESSINGS) IMPLANT
DRSG OPSITE POSTOP 4X8 (GAUZE/BANDAGES/DRESSINGS) IMPLANT
ELECT REM PT RETURN 9FT ADLT (ELECTROSURGICAL) ×1
ELECTRODE REM PT RTRN 9FT ADLT (ELECTROSURGICAL) ×1 IMPLANT
GLOVE BIO SURGEON STRL SZ8 (GLOVE) ×1 IMPLANT
GLOVE BIOGEL PI IND STRL 8 (GLOVE) ×1 IMPLANT
GLOVE PI ORTHO PRO STRL 7.5 (GLOVE) ×2 IMPLANT
GLOVE PI ORTHO PRO STRL SZ8 (GLOVE) ×2 IMPLANT
GLOVE SURG SYN 7.5 E (GLOVE) ×1
GLOVE SURG SYN 7.5 PF PI (GLOVE) ×1 IMPLANT
GOWN SRG XL LVL 3 NONREINFORCE (GOWNS) ×1 IMPLANT
GOWN STRL REUS W/ TWL LRG LVL3 (GOWN DISPOSABLE) ×1 IMPLANT
GOWN STRL REUS W/ TWL XL LVL3 (GOWN DISPOSABLE) ×1 IMPLANT
HOOD PEEL AWAY T7 (MISCELLANEOUS) ×2 IMPLANT
INSERT COMP PS 8-11 GH LT (Insert) IMPLANT
IV NS IRRIG 3000ML ARTHROMATIC (IV SOLUTION) ×1 IMPLANT
KIT TURNOVER KIT A (KITS) ×1 IMPLANT
MANIFOLD NEPTUNE II (INSTRUMENTS) ×1 IMPLANT
MARKER SKIN DUAL TIP RULER LAB (MISCELLANEOUS) ×1 IMPLANT
MAT ABSORB FLUID 56X50 GRAY (MISCELLANEOUS) ×1 IMPLANT
NDL HYPO 21X1.5 SAFETY (NEEDLE) ×1 IMPLANT
NEEDLE HYPO 21X1.5 SAFETY (NEEDLE) ×1
PACK TOTAL KNEE (MISCELLANEOUS) ×1 IMPLANT
PAD ARMBOARD 7.5X6 YLW CONV (MISCELLANEOUS) ×3 IMPLANT
PAD WRAPON POLAR KNEE (MISCELLANEOUS) ×1 IMPLANT
PENCIL SMOKE EVACUATOR (MISCELLANEOUS) ×1 IMPLANT
PIN DRILL HDLS TROCAR 75 4PK (PIN) IMPLANT
PULSAVAC PLUS IRRIG FAN TIP (DISPOSABLE) ×1
SCREW FEMALE HEX FIX 25X2.5 (ORTHOPEDIC DISPOSABLE SUPPLIES) IMPLANT
SCREW HEX HEADED 3.5X27 DISP (ORTHOPEDIC DISPOSABLE SUPPLIES) IMPLANT
SLEEVE SCD COMPRESS KNEE MED (STOCKING) ×1 IMPLANT
SOLUTION IRRIG SURGIPHOR (IV SOLUTION) ×1 IMPLANT
STEM POLY PAT PLY 35M KNEE (Knees) IMPLANT
STEM TIB ST PERS 14+30 (Stem) IMPLANT
STEM TIBIA 5 DEG SZ H L KNEE (Knees) IMPLANT
SUT STRATA 1 CT-1 DLB (SUTURE) ×1
SUT STRATAFIX 14 PDO 48 VLT (SUTURE) ×1 IMPLANT
SUT STRATAFIX PDO 1 14 VIOLET (SUTURE) ×1
SUT VIC AB 0 CT1 36 (SUTURE) ×1 IMPLANT
SUT VIC AB 2-0 CT2 27 (SUTURE) ×2 IMPLANT
SUT VIC AB 2-0 SH 27XBRD (SUTURE) IMPLANT
SUT VICRYL 1-0 27IN ABS (SUTURE) ×1
SUTURE STRATA SPIR 4-0 18 (SUTURE) ×1 IMPLANT
SUTURE VICRYL 1-0 27IN ABS (SUTURE) ×1 IMPLANT
SYR 30ML LL (SYRINGE) ×2 IMPLANT
TAPE CLOTH 3X10 WHT NS LF (GAUZE/BANDAGES/DRESSINGS) ×1 IMPLANT
TIBIA STEM 5 DEG SZ H L KNEE (Knees) ×1 IMPLANT
TIP FAN IRRIG PULSAVAC PLUS (DISPOSABLE) ×1 IMPLANT
TOWEL OR 17X26 4PK STRL BLUE (TOWEL DISPOSABLE) IMPLANT
TRAP FLUID SMOKE EVACUATOR (MISCELLANEOUS) ×1 IMPLANT
WATER STERILE IRR 1000ML POUR (IV SOLUTION) ×1 IMPLANT
WRAPON POLAR PAD KNEE (MISCELLANEOUS) ×1

## 2023-02-24 NOTE — Interval H&P Note (Signed)
Patient history and physical updated. Consent reviewed including risks, benefits, and alternatives to surgery. Patient agrees with above plan to proceed with left total knee arthroplasty.

## 2023-02-24 NOTE — Anesthesia Procedure Notes (Addendum)
Spinal  Patient location during procedure: OR Start time: 02/24/2023 1:32 PM End time: 02/24/2023 1:38 PM Reason for block: surgical anesthesia Staffing Performed: resident/CRNA  Anesthesiologist: Louie Boston, MD Resident/CRNA: Morene Crocker, CRNA Other anesthesia staff: Benay Spice, RN Performed by: Morene Crocker, CRNA Authorized by: Louie Boston, MD   Preanesthetic Checklist Completed: patient identified, IV checked, site marked, risks and benefits discussed, surgical consent, monitors and equipment checked, pre-op evaluation and timeout performed Spinal Block Patient position: sitting Prep: ChloraPrep Patient monitoring: heart rate, continuous pulse ox and blood pressure Approach: midline Location: L3-4 Injection technique: single-shot Needle Needle type: Pencan  Needle gauge: 24 G Needle length: 9 cm Assessment Sensory level: T10 Events: CSF return Additional Notes Negative paresthesia. Negative blood return. Positive free-flowing CSF. Expiration date of kit checked and confirmed. Patient tolerated procedure well, without complications. Successful on first attempt, no complications noted. Pt. Tolerated procedure well without any c/o pain/discomfort.

## 2023-02-24 NOTE — Op Note (Signed)
Patient Name: Rodney Marshall  ZOX:[id number]  Pre-Operative Diagnosis: Left knee Osteoarthritis  Post-Operative Diagnosis: (same)  Procedure: Left Total Knee Arthroplasty  Components/Implants: Femur: Persona Size 11 CR   Tibia: Persona Size H w/ 14x36mm stem extension  Poly: 11mm MC  Patella: 35x64mm symmetric  Femoral Valgus Cut Angle: 5 degrees  Distal Femoral Re-cut: none  Patella Resurfacing: yes   Date of Surgery: 02/24/2023  Surgeon: Reinaldo Berber MD  Assistant: Amador Cunas PA (present and scrubbed throughout the case, critical for assistance with exposure, retraction, instrumentation, and closure)   Anesthesiologist: Joelene Millin  Anesthesia: Spinal  Tourniquet Time: 64 min  EBL: 50cc  Complications: None   Brief history: The patient is a 78 year old male with a history of osteoarthritis of the left knee with pain limiting their range of motion and activities of daily living, which has failed multiple attempts at conservative therapy.  The risks and benefits of total knee arthroplasty as definitive surgical treatment were discussed with the patient, who opted to proceed with the operation.  After outpatient medical clearance and optimization was completed the patient was admitted to [facility] for the procedure.  All preoperative films were reviewed and an appropriate surgical plan was made prior to surgery. Preoperative range of motion was 10 to 115 with a 10 deg flexion contracture.  Description of procedure: The patient was brought to the operating room where laterality was confirmed by all those present to be the left side.   Spinal anesthesia was administered and the patient received an intravenous dose of antibiotics for surgical prophylaxis and a dose of tranexamic acid.  Patient is positioned supine on the operating room table with all bony prominences well-padded.  A well-padded tourniquet was applied to the left thigh.  The knee was then  prepped and draped in usual sterile fashion with multiple layers of adhesive and nonadhesive drapes.  All of those present in the operating room participated in a surgical timeout laterality and patient were confirmed.   An Esmarch was wrapped around the extremity and the leg was elevated and the knee flexed.  The tourniquet was inflated to a pressure of 250 mmHg. The Esmarch was removed and the leg was brought down to full extension.  The patella and tibial tubercle identified and outlined using a marking pen and a midline skin incision was made with a knife carried through the subcutaneous tissue down to the extensor retinaculum.  After exposure of the extensor mechanism the medial parapatellar arthrotomy was performed with a scalpel and electrocautery extending down medial and distal to the tibial tubercle taking care to avoid incising the patellar tendon.   A standard medial release was performed over the proximal tibia.  The knee was brought into extension in order to excise the fat pad taking care not to damage the patella tendon.  The superior soft tissue was removed from the anterior surface of the distal femur to visualize for the procedure.  The knee was then brought into flexion with the patella subluxed laterally and subluxing the tibia anteriorly.  The ACL was transected and removed with electrocautery and additional soft tissue was removed from the proximal surface of the tibia to fully expose. The PCL was found to be mostly disrupted with a few fibers intact which were preserved.   An extramedullary tibial cutting guide was then applied to the leg with a spring-loaded ankle clamp placed around the distal tibia just above the malleoli the angulation of the guide was adjusted to give  some posterior slope in the tibial resection with an appropriate varus/valgus alignment.  The resection guide was then pinned to the proximal tibia and the proximal tibial surface was resected with an oscillating saw.   Careful attention was paid to ensure the blade did not disrupt any of the soft tissues including any lateral or medial ligament.  Attention was then turned to the femur, with the knee slightly flexed a opening drill was used to enter the medullary canal of the femur.  After removing the drill marrow was suctioned out to decompress the distal femur.  An intramedullary femoral guide was then inserted into the drill hole and the alignment guide was seated firmly against the distal end of the medial femoral condyle.  The distal femoral cutting guide was then attached and pinned securely to the anterior surface of the femur and the intramedullary rod and alignment guide was removed.  Distal femur resection was then performed with an oscillating saw with retractors protecting medial and laterally.   The distal cutting block was then removed and the extension gap was checked with a spacer.  Extension gap was found to be appropriately sized to accommodate the spacer block.   The femoral sizing guide was then placed securely into the posterior condyles of the femur and the femoral size was measured and determined to be 11.  The size 11; 4-in-1 cutting guide was placed in position and secured with 2 pins.  The anterior posterior and chamfer resections were then performed with an oscillating saw.  Bony fragments and osteophytes were then removed.  Using a lamina spreader the posterior medial and lateral condyles were checked for additional osteophytes and posterior soft tissue remnants.  Any remaining meniscus was removed at this time.  Periarticular injection was performed in the meniscal rims and posterior capsule with aspiration performed to ensure no intravascular injection.   The tibia was then exposed and the tibial trial was pinned onto the plateau after confirming appropriate orientation and rotation.  Using the drill bushing the tibia was prepared to the appropriate drill depth.  Tibial broach impactor was  then driven through the punch guide using a mallet.  The femoral trial component was then inserted onto the femur.  A trial tibial polyethylene bearing was then placed and the knee was reduced.  The knee achieved full extension with no hyperextension and was found to be balanced in flexion and extension with the trials in place.  The knee was then brought into full extension the patella was everted and held with 2 Kocher clamps.  The articular surface of the patella was then resected with an patella reamer and saw after careful measurement with a caliper.  The patella was then prepared with the drill guide and a trial patella was placed.  The knee was then taken through range of motion and it was found that the patella articulated appropriately with the trochlea and good patellofemoral motion without subluxation.    The correct final components for implantation were confirmed and opened by the circulator nurse.  A bone plug was fashioned from the patient's bone cuts to plug the intramedullary femoral canal access hole and was tamped into place.  The prepared surfaces of the patella femur and tibia were cleaned with pulsatile lavage to remove all blood fat and other material and then the surfaces were dried.  2 bags of cement were mixed under vacuum and the components were cemented into place.  Excess cement was removed with curettes and forceps. A  trial polyethylene tibial component was placed and the knee was brought into extension to allow the cement to set.  At this time the periarticular injection cocktail was placed in the soft tissues surrounding the knee.  After full curing of the cement the balance of the knee was checked again and the final polyethylene size was confirmed. The tibial component was irrigated and locking mechanism checked to ensure it was clear of debris. The real polyethylene tibial component was implanted and the knee was brought through a range of motion.   The knee was then irrigated  with copious amount of normal saline via pulsatile lavage to remove all loose bodies and other debris.  The knee was then irrigated with surgiphor betadine based wash and reirrigated with saline.  The tourniquet was then dropped and all bleeding vessels were identified and coagulated.  The arthrotomy was approximated with #1 Vicryl and closed with #1 Stratafix suture.  The knee was brought into slight flexion and the subcutaneous tissues were closed with 0 Vicryl, 2-0 Vicryl and a running subcuticular 4-0 stratafix barbed suture.  Skin was then glued with Dermabond.  A sterile adhesive dressing was then placed along with a sequential compression device to the calf, a Ted stocking, and a cryotherapy cuff.   Sponge, needle, and Lap counts were all correct at the end of the case.   The patient was transferred off of the operating room table to a hospital bed, good pulses were found distally on the operative side.  The patient was transferred to the recovery room in stable condition.

## 2023-02-24 NOTE — Anesthesia Preprocedure Evaluation (Signed)
Anesthesia Evaluation  Patient identified by MRN, date of birth, ID band Patient awake    Reviewed: Allergy & Precautions, NPO status , Patient's Chart, lab work & pertinent test results  History of Anesthesia Complications Negative for: history of anesthetic complications  Airway Mallampati: III  TM Distance: >3 FB Neck ROM: full    Dental no notable dental hx.    Pulmonary neg pulmonary ROS   Pulmonary exam normal        Cardiovascular hypertension, On Medications negative cardio ROS Normal cardiovascular exam     Neuro/Psych negative neurological ROS  negative psych ROS   GI/Hepatic negative GI ROS, Neg liver ROS,,,  Endo/Other  negative endocrine ROS    Renal/GU      Musculoskeletal   Abdominal   Peds  Hematology negative hematology ROS (+)   Anesthesia Other Findings Past Medical History: No date: Hypertension No date: Vertigo  Past Surgical History: No date: BACK SURGERY     Comment:  2018 05/21/2021: CYSTOSCOPY WITH LITHOLAPAXY; N/A     Comment:  Procedure: CYSTOSCOPY WITH LITHOLAPAXY;  Surgeon:               Sondra Come, MD;  Location: [facility] ORS;  Service:               Urology;  Laterality: N/A; 05/21/2021: HOLEP-LASER ENUCLEATION OF THE PROSTATE WITH  MORCELLATION; N/A     Comment:  Procedure: HOLEP-LASER ENUCLEATION OF THE PROSTATE WITH               MORCELLATION;  Surgeon: Sondra Come, MD;  Location:               [facility] ORS;  Service: Urology;  Laterality: N/A;  BMI    Body Mass Index: 24.41 kg/m      Reproductive/Obstetrics negative OB ROS                             Anesthesia Physical Anesthesia Plan  ASA: 2  Anesthesia Plan: Spinal   Post-op Pain Management: Regional block*, Toradol IV (intra-op)* and Ofirmev IV (intra-op)*   Induction:   PONV Risk Score and Plan: 1 and Propofol infusion, TIVA and Treatment may vary due to age or medical  condition  Airway Management Planned: Natural Airway and Nasal Cannula  Additional Equipment:   Intra-op Plan:   Post-operative Plan:   Informed Consent: I have reviewed the patients History and Physical, chart, labs and discussed the procedure including the risks, benefits and alternatives for the proposed anesthesia with the patient or authorized representative who has indicated his/her understanding and acceptance.     Dental Advisory Given  Plan Discussed with: Anesthesiologist, CRNA and Surgeon  Anesthesia Plan Comments: (Patient reports no bleeding problems and no anticoagulant use.  Plan for spinal with backup GA  Patient consented for risks of anesthesia including but not limited to:  - adverse reactions to medications - damage to eyes, teeth, lips or other oral mucosa - nerve damage due to positioning  - risk of bleeding, infection and or nerve damage from spinal that could lead to paralysis - risk of headache or failed spinal - damage to teeth, lips or other oral mucosa - sore throat or hoarseness - damage to heart, brain, nerves, lungs, other parts of body or loss of life  Patient voiced understanding and assent.)       Anesthesia Quick Evaluation

## 2023-02-24 NOTE — Transfer of Care (Signed)
Immediate Anesthesia Transfer of Care Note  Patient: Rodney Marshall  Procedure(s) Performed: TOTAL KNEE ARTHROPLASTY (Left: Knee)  Patient Location: PACU  Anesthesia Type:Spinal  Level of Consciousness: awake, alert , and drowsy  Airway & Oxygen Therapy: Patient Spontanous Breathing and Patient connected to face mask oxygen  Post-op Assessment: Report given to RN and Post -op Vital signs reviewed and stable  Post vital signs: Reviewed and stable  Last Vitals:  Vitals Value Taken Time  BP 122/62 02/24/23 1600  Temp 36.8 C 02/24/23 1558  Pulse 70 02/24/23 1601  Resp 15 02/24/23 1601  SpO2 99 % 02/24/23 1601  Vitals shown include unfiled device data.  Last Pain:  Vitals:   02/24/23 1200  TempSrc: Tympanic  PainSc: 0-No pain         Complications: No notable events documented.

## 2023-02-24 NOTE — H&P (Signed)
History of Present Illness: The patient is an 78 y.o. male seen in clinic today for who presents for follow-up evaluation of his bilateral knees. The patient reports ongoing severe pain in both knees with known arthritis. He reports his left knee is worse than his right knee with some sensations of instability with ambulation. He had a last steroid injection on 09/06/2022 and reports he got 2 months of relief from that shot which is about half of what he got from a shot before. He reports is getting diminishing results from conservative treatments with ibuprofen and the injections. He is here today to discuss a more permanent solution for his knees as they are becoming functionally limiting. The patient denies fevers, chills, numbness, tingling, shortness of breath, chest pain, recent illness, or any trauma.  Patient is a non-smoker, nondiabetic with a BMI of 24.5  Past Medical History: Past Medical History:  Diagnosis Date  High cholesterol  Hypertension  Migraine   Past Surgical History: Past Surgical History:  Procedure Laterality Date  ENDOSCOPIC CARPAL TUNNEL RELEASE Left 2012  lower back surgery 2016  COLONOSCOPY 09/04/2017  [facility] (Mother) CBF 08/2022  MICROSURGERY Right 09/22/2017  Procedure: MICROSURGICAL TECHNIQUES, REQUIRING USE OF OPERATING MICROSCOPE; Surgeon: Tawanna Sat, MD; Location: [facility] OR; Service: Orthopedics; Laterality: Right;   Past Family History: Family History  Problem Relation Age of Onset  Breast cancer Mother  Colon cancer Mother  Other Father  hardening of arteries  Stroke Father  Hyperlipidemia (Elevated cholesterol) Father   Medications: Current Outpatient Medications  Medication Sig Dispense Refill  ezetimibe (ZETIA) 10 mg tablet TAKE 1 TABLET(10 MG) BY MOUTH EVERY DAY 90 tablet 1  hydroCHLOROthiazide (HYDRODIURIL) 12.5 MG tablet TAKE 1 TABLET(12.5 MG) BY MOUTH DAILY 90 tablet 0  ibuprofen (MOTRIN) 400 MG tablet Take 200 mg by mouth 2 (two) times  daily   No current facility-administered medications for this visit.   Allergies: Allergies  Allergen Reactions  Statins-Hmg-Coa Reductase Inhibitors Muscle Pain    Visit Vitals: Vitals:  02/07/23 1112  BP: (!) 148/78    Review of Systems:  A comprehensive 14 point ROS was performed, reviewed, and the pertinent orthopaedic findings are documented in the HPI.  Physical Exam: General/Constitutional: No apparent distress: well-nourished and well developed. Eyes: Pupils equal, round with synchronous movement. Pulmonary exam: Lungs clear to auscultation bilaterally no wheezing rales or rhonchi Cardiac exam: Regular rate and rhythm no obvious murmurs rubs or gallops. Integumentary: No impressive skin lesions present, except as noted in detailed exam. Neuro/Psych: Normal mood and affect, oriented to person, place and time.  Comprehensive Knee Exam: Gait shuffling and slow  Alignment Neutral   Inspection Right Left  Skin Large bony deformity of the knee area Large bony deformity of the knee area especially medially  Soft Tissue No focal soft tissue swelling No focal soft tissue swelling  Quad Atrophy None None   Palpation  Right Left  Tenderness Minimal medial joint line tenderness medial joint line tenderness to palpation  Crepitus + patellofemoral and tibiofemoral crepitus + patellofemoral and tibiofemoral crepitus  Effusion None None   Range of Motion Right Left  Flexion 10-115 10-115  Extension Passively extendable to 5 degrees with contracture without hyperextension Passively extendable to 5 degrees with contracture without hyperextension   Ligamentous Exam Right Left  Lachman Normal Normal  Valgus 0 Normal Normal  Valgus 30 Normal Normal  Varus 0 Normal Normal  Varus 30 Normal Normal  Anterior Drawer Normal Normal  Posterior Drawer Normal Normal  Meniscal Exam Right Left  Hyperflexion Test Negative Positive  Hyperextension Test Negative Negative   McMurray's Negative Negative   Neurovascular Right Left  Quadriceps Strength 5/5 5/5  Hamstring Strength 5/5 5/5  Hip Abductor Strength 4/5 4/5  Distal Motor Normal Normal  Distal Sensory Normal light touch sensation Normal light touch sensation  Distal Pulses Normal Normal    Imaging Studies: I have reviewed AP, lateral,sunrise, and flexed PA weight bearing knee X-rays (5 views) of the bilateral knees ordered and taken today in the office show severe degenerative changes to both knees with tricompartmental degenerative changes in both knees with joint space narrowing osteophyte formation sclerosis and subchondral cyst formation Kellgren-Lawrence grade 4 in both knees. The left knee has a primarily valgus deformity and the right knee has a primarily varus deformity.   Assessment:  ICD-10-CM  1. 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 Based upon the patient's continued symptoms and failure to respond to conservative treatment, I have recommended a left total knee replacement for this patient. A long discussion took place with the patient describing what a total joint replacement is and what the procedure would entail. A knee model, similar to the implants that will be used during the operation, was utilized to demonstrate the implants. Choices of implant manufactures were discussed and reviewed. The ability to secure the implant utilizing cement or cementless (press fit) fixation was discussed. The approach and exposure was discussed.   The hospitalization and post-operative care and rehabilitation were also discussed. The use of perioperative antibiotics and DVT prophylaxis were discussed. The risk, benefits and alternatives to a surgical intervention were discussed at length with the patient. The patient was also advised of risks  related to the medical comorbidities and elevated body mass index (BMI). A lengthy discussion took place to review the most common complications including but not limited to: stiffness, loss of function, complex regional pain syndrome, deep vein thrombosis, pulmonary embolus, heart attack, stroke, infection, wound breakdown, numbness, intraoperative fracture, damage to nerves, tendon,muscles, arteries or other blood vessels, death and other possible complications from anesthesia. The patient was told that we will take steps to minimize these risks by using sterile technique, antibiotics and DVT prophylaxis when appropriate and follow the patient postoperatively in the office setting to monitor progress. The possibility of recurrent pain, no improvement in pain and actual worsening of pain were also discussed with the patient.   Patient asked about and confirms no history of any reactions to metal or metal allergy in the past.  The discharge plan of care focused on the patient going home following surgery. The patient was encouraged to make the necessary arrangements to have someone stay with them when they are discharged home.   The benefits of surgery were discussed with the patient including the potential for improving the patient's current clinical condition through operative intervention. Alternatives to surgical intervention including continued conservative management were also discussed in detail. All questions were answered to the satisfaction of the patient. The patient participated and agreed to the plan of care as well as the use of the recommended implants for their total knee replacement surgery. An information packet was given to the patient to review prior to surgery.   Patient received clearance. All questions answered patient agrees above plan preparations for left total knee replacement.  Portions of  this record have been created using Scientist, clinical (histocompatibility and immunogenetics). Dictation errors have been  sought, but may not have been identified and corrected.  Reinaldo Berber MD

## 2023-02-25 ENCOUNTER — Other Ambulatory Visit: Payer: Self-pay

## 2023-02-25 ENCOUNTER — Emergency Department
Admission: EM | Admit: 2023-02-25 | Discharge: 2023-02-25 | Disposition: A | Discharge status: Home / Self Care | Payer: Medicare Other | Source: Home / Self Care | Attending: Student in an Organized Health Care Education/Training Program | Admitting: Student in an Organized Health Care Education/Training Program

## 2023-02-25 ENCOUNTER — Encounter: Payer: Self-pay | Admitting: Orthopedic Surgery

## 2023-02-25 DIAGNOSIS — Z96652 Presence of left artificial knee joint: Secondary | ICD-10-CM | POA: Insufficient documentation

## 2023-02-25 DIAGNOSIS — I10 Essential (primary) hypertension: Secondary | ICD-10-CM | POA: Insufficient documentation

## 2023-02-25 DIAGNOSIS — M1712 Unilateral primary osteoarthritis, left knee: Secondary | ICD-10-CM | POA: Diagnosis not present

## 2023-02-25 DIAGNOSIS — Z471 Aftercare following joint replacement surgery: Secondary | ICD-10-CM | POA: Insufficient documentation

## 2023-02-25 DIAGNOSIS — Z5189 Encounter for other specified aftercare: Secondary | ICD-10-CM

## 2023-02-25 LAB — BASIC METABOLIC PANEL
Anion gap: 9 (ref 5–15)
BUN: 25 mg/dL — ABNORMAL HIGH (ref 8–23)
CO2: 23 mmol/L (ref 22–32)
Calcium: 8.7 mg/dL — ABNORMAL LOW (ref 8.9–10.3)
Chloride: 103 mmol/L (ref 98–111)
Creatinine, Ser: 1.02 mg/dL (ref 0.61–1.24)
GFR, Estimated: >60 mL/min (ref >60)
Glucose, Bld: 115 mg/dL — ABNORMAL HIGH (ref 70–99)
Potassium: 4 mmol/L (ref 3.5–5.1)
Sodium: 135 mmol/L (ref 135–145)

## 2023-02-25 LAB — CBC
HCT: 45.2 % (ref 39.0–52.0)
Hemoglobin: 15.7 g/dL (ref 13.0–17.0)
MCH: 31.3 pg (ref 26.0–34.0)
MCHC: 34.7 g/dL (ref 30.0–36.0)
MCV: 90 fL (ref 80.0–100.0)
Platelets: 281 10*3/uL (ref 150–400)
RBC: 5.02 MIL/uL (ref 4.22–5.81)
RDW: 12.7 % (ref 11.5–15.5)
WBC: 18.6 10*3/uL — ABNORMAL HIGH (ref 4.0–10.5)
nRBC: 0 % (ref 0.0–0.2)

## 2023-02-25 MED ORDER — ONDANSETRON HCL 4 MG PO TABS: 4.0000 mg | ORAL_TABLET | Freq: Four times a day (QID) | ORAL | 0 refills | Status: AC | PRN

## 2023-02-25 MED ORDER — ACETAMINOPHEN 500 MG PO TABS
ORAL_TABLET | ORAL | Status: AC
  Filled 2023-02-25: qty 2

## 2023-02-25 MED ORDER — ENOXAPARIN SODIUM 30 MG/0.3ML IJ SOSY
PREFILLED_SYRINGE | INTRAMUSCULAR | Status: AC
  Filled 2023-02-25: qty 0.3

## 2023-02-25 MED ORDER — OXYCODONE HCL 5 MG PO TABS: 2.5000 mg | ORAL_TABLET | Freq: Three times a day (TID) | ORAL | 0 refills | Status: AC | PRN

## 2023-02-25 MED ORDER — ENOXAPARIN SODIUM 40 MG/0.4ML IJ SOSY: 40.0000 mg | PREFILLED_SYRINGE | INTRAMUSCULAR | 0 refills | Status: AC

## 2023-02-25 MED ORDER — DOCUSATE SODIUM 100 MG PO CAPS: 100.0000 mg | ORAL_CAPSULE | Freq: Two times a day (BID) | ORAL | 0 refills | Status: AC

## 2023-02-25 MED ORDER — ACETAMINOPHEN 500 MG PO TABS: 1000.0000 mg | ORAL_TABLET | Freq: Three times a day (TID) | ORAL | 0 refills | Status: AC

## 2023-02-25 MED ORDER — DOCUSATE SODIUM 100 MG PO CAPS
ORAL_CAPSULE | ORAL | Status: AC
  Filled 2023-02-25: qty 1

## 2023-02-25 MED ORDER — TRAMADOL HCL 50 MG PO TABS
ORAL_TABLET | ORAL | Status: AC
  Filled 2023-02-25: qty 1

## 2023-02-25 MED ORDER — HYDROCODONE-ACETAMINOPHEN 5-325 MG PO TABS
ORAL_TABLET | ORAL | Status: AC
  Filled 2023-02-25: qty 1

## 2023-02-25 MED ORDER — KETOROLAC TROMETHAMINE 15 MG/ML IJ SOLN
INTRAMUSCULAR | Status: AC
  Filled 2023-02-25: qty 1

## 2023-02-25 MED ORDER — EZETIMIBE 10 MG PO TABS
ORAL_TABLET | ORAL | Status: AC
Start: 2023-02-25 — End: ?
  Filled 2023-02-25: qty 1

## 2023-02-25 MED ORDER — PANTOPRAZOLE SODIUM 40 MG PO TBEC
DELAYED_RELEASE_TABLET | ORAL | Status: AC
  Filled 2023-02-25: qty 1

## 2023-02-25 MED ORDER — TRAMADOL HCL 50 MG PO TABS: 50.0000 mg | ORAL_TABLET | Freq: Four times a day (QID) | ORAL | 0 refills | Status: AC | PRN

## 2023-02-25 MED ORDER — CELECOXIB 100 MG PO CAPS: 100.0000 mg | ORAL_CAPSULE | Freq: Two times a day (BID) | ORAL | 0 refills | Status: AC

## 2023-02-25 NOTE — ED Provider Notes (Signed)
[facility] Emergency Department Provider Note     Event Date/Time   First MD Initiated Contact with Patient 02/25/23 2301     (approximate)   History   Post-op Problem   HPI  Rodney Marshall is a 78 y.o. male with a history of HTN present 24-hours s/p left TKR. He was at home, when he began to experience spontaneous bleeding from his dressing. He denies FCS, purulent drainage or recent trauma.      Physical Exam   Triage Vital Signs: ED Triage Vitals  Encounter Vitals Group     BP 02/25/23 2151 (!) 148/73     Systolic BP Percentile --      Diastolic BP Percentile --      Pulse Rate 02/25/23 2151 78     Resp 02/25/23 2151 16     Temp 02/25/23 2151 97.7 F (36.5 C)     Temp Source 02/25/23 2151 Oral     SpO2 02/25/23 2151 98 %     Weight 02/25/23 2150 180 lb (81.6 kg)     Height 02/25/23 2150 6' (1.829 m)     Head Circumference --      Peak Flow --      Pain Score 02/25/23 2150 0     Pain Loc --      Pain Education --      Exclude from Growth Chart --     Most recent vital signs: Vitals:   02/25/23 2151  BP: (!) 148/73  Pulse: 78  Resp: 16  Temp: 97.7 F (36.5 C)  SpO2: 98%    General Awake, no distress. NAD HEENT NCAT. PERRL. EOMI. No rhinorrhea. Mucous membranes are moist. CV:  Good peripheral perfusion. RRR RESP:  Normal effort. CTA ABD:  No distention.  MSK:  Left knee with honeycomb dressing in place.  The dressing is bloodsoaked at this time, with a moderate amount of blood expressible from the medial aspect of the adhesive.   ED Results / Procedures / Treatments   Labs (all labs ordered are listed, but only abnormal results are displayed) Labs Reviewed - No data to display   EKG   RADIOLOGY    PROCEDURES:  Critical Care performed: No  Procedures   MEDICATIONS ORDERED IN ED: Medications - No data to display   IMPRESSION / MDM / ASSESSMENT AND PLAN / ED COURSE  I reviewed the triage vital signs and  the nursing notes.                              Differential diagnosis includes, but is not limited to, wound dehiscence, hematoma, seroma  Patient's presentation is most consistent with acute complicated illness / injury requiring diagnostic workup.  Patient's diagnosis is consistent with likely wound dehiscence with some mild postop bleeding.  I offered to complete dressing change to the patient including replacement of the honeycomb dressing after determining the source of bleeding.  The patient declined removal of the bloody, intact honeycomb dressing.  He opted instead, to have me apply 4 x 4's to the distal end of the dressing, and reapply the Ace bandage for compression.  Patient seems reasonable for discharge and outpatient management with the surgeon tomorrow morning at 8 AM which has been established by the patient's wife.. Patient will be discharged home with instructions to elevate the leg, and apply ice to over the new reinforced dressing. Patient is to follow  up with Dr. Audelia Acton as scheduled, as needed or otherwise directed. Patient is given ED precautions to return to the ED for any worsening or new symptoms.     FINAL CLINICAL IMPRESSION(S) / ED DIAGNOSES   Final diagnoses:  Visit for wound check     Rx / DC Orders   ED Discharge Orders     None        Note:  This document was prepared using Dragon voice recognition software and may include unintentional dictation errors.    Lissa Hoard, PA-C 02/25/23 2347    Janith Lima, MD 03/03/23 [phone]

## 2023-02-25 NOTE — Evaluation (Signed)
Physical Therapy Evaluation Patient Details Name: Rodney Marshall MRN: [id number] DOB: 1945-02-03 Today's Date: 02/25/2023  History of Present Illness  Patient is a 78 year old male with left knee osteoarthritis s/p left total knee arthroplasty.  Clinical Impression  Patient is agreeable to PT evaluation. He is eager to go home today. He is independent at baseline without assistive device and has supportive spouse.  Gait training initiated today with reinforcement of heel toe gait pattern with improving gait kinematics. Patient ambulated in hallway without difficulty. Stair training completed. Provided patient with exercise handout. Discussed positioning strategies to maintain knee ROM. Mobility is adequate for discharge home today. PT will continue to follow to maximize independence and facilitate return to prior level of function.       If plan is discharge home, recommend the following: Assist for transportation   Can travel by private vehicle        Equipment Recommendations None recommended by PT  Recommendations for Other Services       Functional Status Assessment Patient has had a recent decline in their functional status and demonstrates the ability to make significant improvements in function in a reasonable and predictable amount of time.     Precautions / Restrictions Precautions Precautions: Knee;Fall Precaution Booklet Issued: Yes (comment) Restrictions Weight Bearing Restrictions Per Provider Order: Yes LLE Weight Bearing Per Provider Order: Weight bearing as tolerated      Mobility  Bed Mobility Overal bed mobility: Needs Assistance Bed Mobility: Supine to Sit, Sit to Supine     Supine to sit: Supervision Sit to supine: Supervision   General bed mobility comments: increased time    Transfers Overall transfer level: Needs assistance Equipment used: Rolling walker (2 wheels) Transfers: Sit to/from Stand Sit to Stand: Supervision                 Ambulation/Gait Ambulation/Gait assistance: Supervision Gait Distance (Feet): 200 Feet Assistive device: Rolling walker (2 wheels) Gait Pattern/deviations: Step-through pattern Gait velocity: decreased     General Gait Details: reinforcement of heel toe giat pattern with carry over demonstrated  Stairs Stairs: Yes Stairs assistance: Supervision Stair Management: One rail Right (going up R side rail, L side going down) Number of Stairs: 4 General stair comments: with initial cues for technique, patient demonstrated correct sequencing  Wheelchair Mobility     Tilt Bed    Modified Rankin (Stroke Patients Only)       Balance Overall balance assessment: Mild deficits observed, not formally tested                                           Pertinent Vitals/Pain Pain Assessment Pain Assessment: 0-10 Pain Score: 5     Home Living Family/patient expects to be discharged to:: Private residence Living Arrangements: Spouse/significant other Available Help at Discharge: Family Type of Home: House Home Access: Stairs to enter Entrance Stairs-Rails: Doctor, general practice of Steps: 7   Home Layout: One level Home Equipment: Agricultural consultant (2 wheels);BSC/3in1      Prior Function Prior Level of Function : Independent/Modified Independent                     Extremity/Trunk Assessment   Upper Extremity Assessment Upper Extremity Assessment: Overall WFL for tasks assessed    Lower Extremity Assessment Lower Extremity Assessment: LLE deficits/detail LLE Deficits / Details: patient able to perform  SLR LLE Sensation: WNL       Communication   Communication Communication: No apparent difficulties  Cognition Arousal: Alert Behavior During Therapy: WFL for tasks assessed/performed Overall Cognitive Status: Within Functional Limits for tasks assessed                                          General Comments       Exercises Total Joint Exercises Goniometric ROM: L knee 5-90 degrees   Assessment/Plan    PT Assessment Patient needs continued PT services  PT Problem List Decreased range of motion;Decreased strength;Decreased activity tolerance;Decreased balance;Decreased mobility;Decreased safety awareness       PT Treatment Interventions Gait training;DME instruction;Stair training;Functional mobility training;Therapeutic exercise;Therapeutic activities;Balance training;Neuromuscular re-education;Cognitive remediation    PT Goals (Current goals can be found in the Care Plan section)  Acute Rehab PT Goals Patient Stated Goal: to go home PT Goal Formulation: With patient Time For Goal Achievement: 03/11/23 Potential to Achieve Goals: Good    Frequency BID     Co-evaluation               AM-PAC PT "6 Clicks" Mobility  Outcome Measure Help needed turning from your back to your side while in a flat bed without using bedrails?: None Help needed moving from lying on your back to sitting on the side of a flat bed without using bedrails?: A Little Help needed moving to and from a bed to a chair (including a wheelchair)?: A Little Help needed standing up from a chair using your arms (e.g., wheelchair or bedside chair)?: A Little Help needed to walk in hospital room?: A Little Help needed climbing 3-5 steps with a railing? : A Little 6 Click Score: 19    End of Session Equipment Utilized During Treatment: Gait belt Activity Tolerance: Patient tolerated treatment well Patient left: in bed;with call bell/phone within reach;with SCD's reapplied (polar care, bone foam) Nurse Communication: Mobility status PT Visit Diagnosis: Difficulty in walking, not elsewhere classified (R26.2);Other abnormalities of gait and mobility (R26.89)    Time: 1610-9604 PT Time Calculation (min) (ACUTE ONLY): 23 min   Charges:   PT Evaluation $PT Eval Low Complexity: 1 Low PT Treatments $Gait Training:  8-22 mins PT General Charges $$ ACUTE PT VISIT: 1 Visit         Donna Bernard, PT, MPT   Rodney Marshall 02/25/2023, 11:38 AM

## 2023-02-25 NOTE — Anesthesia Postprocedure Evaluation (Signed)
Anesthesia Post Note  Patient: Rodney Marshall  Procedure(s) Performed: TOTAL KNEE ARTHROPLASTY (Left: Knee)  Patient location during evaluation: Nursing Unit Anesthesia Type: Spinal Level of consciousness: oriented and awake and alert Pain management: pain level controlled Vital Signs Assessment: post-procedure vital signs reviewed and stable Respiratory status: spontaneous breathing and respiratory function stable Cardiovascular status: blood pressure returned to baseline and stable Postop Assessment: no headache, no backache, no apparent nausea or vomiting and patient able to bend at knees Anesthetic complications: no   No notable events documented.   Last Vitals:  Vitals:   02/25/23 0052 02/25/23 0600  BP: (!) 146/71 137/76  Pulse: 72 70  Resp: 16 16  Temp: 36.9 C 37 C  SpO2: 92% 97%    Last Pain:  Vitals:   02/25/23 0600  TempSrc: Temporal  PainSc:                  Jeanine Luz

## 2023-02-25 NOTE — Progress Notes (Signed)
   Subjective: 1 Day Post-Op Procedure(s) (LRB): TOTAL KNEE ARTHROPLASTY (Left) Patient reports pain as mild.   Patient is well, and has had no acute complaints or problems Denies any CP, SOB, ABD pain. We will continue therapy today.  Plan is to go Home after hospital stay.  Objective: Vital signs in last 24 hours: Temp:  [97.4 F (36.3 C)-98.6 F (37 C)] 98.5 F (36.9 C) (01/28 0749) Pulse Rate:  [65-80] 71 (01/28 0749) Resp:  [10-18] 15 (01/28 0749) BP: (117-187)/(62-93) 145/66 (01/28 0749) SpO2:  [91 %-100 %] 95 % (01/28 0749) Weight:  [81.6 kg] 81.6 kg (01/27 1200)  Intake/Output from previous day: 01/27 0701 - 01/28 0700 In: 1293.3 [I.V.:700; IV Piggyback:593.3] Out: 100 [Blood:100] Intake/Output this shift: No intake/output data recorded.  Recent Labs    02/25/23 0559  HGB 15.7   Recent Labs    02/25/23 0559  WBC 18.6*  RBC 5.02  HCT 45.2  PLT 281   Recent Labs    02/25/23 0559  NA 135  K 4.0  CL 103  CO2 23  BUN 25*  CREATININE 1.02  GLUCOSE 115*  CALCIUM 8.7*   No results for input(s): "LABPT", "INR" in the last 72 hours.  EXAM General - Patient is Alert, Appropriate, and Oriented Extremity - Neurovascular intact Sensation intact distally Intact pulses distally Dorsiflexion/Plantar flexion intact Dressing - dressing C/D/I and no drainage Motor Function - intact, moving foot and toes well on exam.   Past Medical History:  Diagnosis Date   Hypertension    Vertigo     Assessment/Plan:   1 Day Post-Op Procedure(s) (LRB): TOTAL KNEE ARTHROPLASTY (Left) Principal Problem:   S/P TKR (total knee replacement), left  Estimated body mass index is 24.41 kg/m as calculated from the following:   Height as of this encounter: 6' (1.829 m).   Weight as of this encounter: 81.6 kg. Advance diet Up with therapy Pain well controlled Labs and vital signs are stable Care management to assist with discharge to home with home health PT today  DVT  Prophylaxis - Lovenox, TED hose, and SCDs Weight-Bearing as tolerated to left leg   T. Cranston Neighbor, PA-C [facility] 02/25/2023, 8:17 AM

## 2023-02-25 NOTE — Progress Notes (Signed)
DISCHARGE NOTE:   Pt dc with IV removed and both TED hose on. Pt voices no concerns or questions at this time. Pt given bone foam and medication scripts. Pt wheeled down by staff to medical mall entrance. Pt transportation provided via pt's wife.

## 2023-02-25 NOTE — Discharge Instructions (Addendum)
Instructions after Total Knee Replacement   Reinaldo Berber M.D.     Dept. of Orthopaedics & Sports Medicine  [facility]  [address]  [place], [place]  [address]  Phone: [phone]   Fax: [phone]    DIET: Drink plenty of non-alcoholic fluids. Resume your normal diet. Include foods high in fiber.  ACTIVITY:  You may use crutches or a walker with weight-bearing as tolerated, unless instructed otherwise. You may be weaned off of the walker or crutches by your Physical Therapist.  Do NOT place pillows under the knee. Anything placed under the knee could limit your ability to straighten the knee.   Continue doing gentle exercises. Exercising will reduce the pain and swelling, increase motion, and prevent muscle weakness.   Please continue to use the TED compression stockings for 2 weeks. You may remove the stockings at night, but should reapply them in the morning. Do not drive or operate any equipment until instructed.  WOUND CARE:  Continue to use the PolarCare or ice packs periodically to reduce pain and swelling. You may begin showering 3 days after surgery with honeycomb dressing. Remove honeycomb dressing 7 days after surgery and continue showering. Allow dermabond to fall off on its own.  MEDICATIONS: You may resume your regular medications. Please take the pain medication as prescribed on the medication. Do not take pain medication on an empty stomach. You have been given a prescription for a blood thinner (Lovenox or Coumadin). Please take the medication as instructed. (NOTE: After completing a 2 week course of Lovenox, take one 81 mg Enteric-coated aspirin twice a day for 3 additional weeks. This along with elevation will help reduce the possibility of phlebitis in your operated leg.) Do not drive or drink alcoholic beverages when taking pain medications.  POSTOPERATIVE CONSTIPATION PROTOCOL Constipation - defined medically as fewer than three stools per  week and severe constipation as less than one stool per week.  One of the most common issues patients have following surgery is constipation.  Even if you have a regular bowel pattern at home, your normal regimen is likely to be disrupted due to multiple reasons following surgery.  Combination of anesthesia, postoperative narcotics, change in appetite and fluid intake all can affect your bowels.  In order to avoid complications following surgery, here are some recommendations in order to help you during your recovery period.  Colace (docusate) - Pick up an over-the-counter form of Colace or another stool softener and take twice a day as long as you are requiring postoperative pain medications.  Take with a full glass of water daily.  If you experience loose stools or diarrhea, hold the colace until you stool forms back up.  If your symptoms do not get better within 1 week or if they get worse, check with your doctor.  Dulcolax (bisacodyl) - Pick up over-the-counter and take as directed by the product packaging as needed to assist with the movement of your bowels.  Take with a full glass of water.  Use this product as needed if not relieved by Colace only.   MiraLax (polyethylene glycol) - Pick up over-the-counter to have on hand.  MiraLax is a solution that will increase the amount of water in your bowels to assist with bowel movements.  Take as directed and can mix with a glass of water, juice, soda, coffee, or tea.  Take if you go more than two days without a movement. Do not use MiraLax more than once per day.  Call your doctor if you are still constipated or irregular after using this medication for 7 days in a row.  If you continue to have problems with postoperative constipation, please contact the office for further assistance and recommendations.  If you experience "the worst abdominal pain ever" or develop nausea or vomiting, please contact the office immediatly for further recommendations for  treatment.   CALL THE OFFICE FOR: Temperature above 101 degrees Excessive bleeding or drainage on the dressing. Excessive swelling, coldness, or paleness of the toes. Persistent nausea and vomiting.  FOLLOW-UP:  You should have an appointment to return to the office in 14 days after surgery. Arrangements have been made for continuation of Physical Therapy (either home therapy or outpatient therapy).   [facility] They will call you to set up when they are coming out to see you   [address], [place], [place] [address] Hours:  Open ? Closes 5?PM Phone: [phone]

## 2023-02-25 NOTE — Discharge Instructions (Addendum)
Keep the wound clean, dry, and covered. Apply ice packs over the dressing. See Dr. Audelia Acton in the morning, as scheduled.

## 2023-02-25 NOTE — ED Triage Notes (Signed)
Pt had left knee replacement yesterday and tonight while sitting at home pt states his incision site began to bleed without stopping.

## 2023-02-25 NOTE — TOC Initial Note (Signed)
Transition of Care [facility]) - Initial/Assessment Note    Patient Details  Name: Rodney Marshall MRN: [id number] Date of Birth: 27-Sep-1945  Transition of Care [facility]) CM/SW Contact:    Marlowe Sax, RN Phone Number: 02/25/2023, 8:59 AM  Clinical Narrative:                                                    [facility]  Is [facility] for [facility] prior to surgery by Surgeons office       Patient Goals and CMS Choice            Expected Discharge Plan and Services         Expected Discharge Date: 02/25/23                                    Prior Living Arrangements/Services                       Activities of Daily Living   ADL Screening (condition at time of admission) Independently performs ADLs?: Yes (appropriate for developmental age) Is the patient deaf or have difficulty hearing?: No Does the patient have difficulty seeing, even when wearing glasses/contacts?: No Does the patient have difficulty concentrating, remembering, or making decisions?: No  Permission Sought/Granted                  Emotional Assessment              Admission diagnosis:  S/P TKR (total knee replacement), left [Z96.652] Patient Active Problem List   Diagnosis Date Noted   S/P TKR (total knee replacement), left 02/24/2023   Bilateral primary osteoarthritis of knee 05/22/2021   Hearing loss 05/22/2021   Hyperlipidemia 05/22/2021   Tinnitus 05/22/2021   Other male erectile dysfunction 08/11/2020   Essential hypertension 09/16/2017   Lumbosacral radiculitis 06/03/2017   Family history of colon cancer 05/07/2017   Pure hypercholesterolemia 05/07/2017   Right inguinal hernia 05/07/2017   PCP:  Marisue Ivan, MD Pharmacy:   [id number] [place], [place] - [address] AT [facility] [address] [facility] [address] [place] [address] Phone: [phone] Fax: [phone]  [facility] [address] [place] - [address] [place] [address] Phone: [phone] Fax: [phone]     Social Drivers of Health (SDOH) Social History: SDOH Screenings   Food Insecurity: No Food Insecurity (02/24/2023)  Housing: Low Risk  (02/24/2023)  Transportation Needs: No Transportation Needs (02/24/2023)  Utilities: Not At Risk (02/24/2023)  Financial Resource Strain: Low Risk  (09/16/2022)   Received from [facility]  Social Connections: Socially Integrated (02/24/2023)  Tobacco Use: Low Risk  (02/24/2023)  Recent Concern: Tobacco Use - Medium Risk (02/18/2023)   Received from [facility]   SDOH Interventions:     Readmission Risk Interventions     No data to display

## 2023-02-25 NOTE — Discharge Summary (Signed)
Physician Discharge Summary  Patient ID: Rodney Marshall MRN: [id number] DOB/AGE: 1945/09/24 78 y.o.  Admit date: 02/24/2023 Discharge date: 02/25/2023  Admission Diagnoses:  S/P TKR (total knee replacement), left [Z96.652]   Discharge Diagnoses: Patient Active Problem List   Diagnosis Date Noted   S/P TKR (total knee replacement), left 02/24/2023   Bilateral primary osteoarthritis of knee 05/22/2021   Hearing loss 05/22/2021   Hyperlipidemia 05/22/2021   Tinnitus 05/22/2021   Other male erectile dysfunction 08/11/2020   Essential hypertension 09/16/2017   Lumbosacral radiculitis 06/03/2017   Family history of colon cancer 05/07/2017   Pure hypercholesterolemia 05/07/2017   Right inguinal hernia 05/07/2017    Past Medical History:  Diagnosis Date   Hypertension    Vertigo      Transfusion: none   Consultants (if any):   Discharged Condition: Improved  Hospital Course: Rodney Marshall is an 78 y.o. male who was admitted 02/24/2023 with a diagnosis of S/P TKR (total knee replacement), left and went to the operating room on 02/24/2023 and underwent the above named procedures.    Surgeries: Procedure(s): TOTAL KNEE ARTHROPLASTY on 02/24/2023 Patient tolerated the surgery well. Taken to PACU where she was stabilized and then transferred to the orthopedic floor.  Started on Lovenox 30 mg q 12 hrs. TEDs and SCDs applied bilaterally. Heels elevated on bed. No evidence of DVT. Negative Homan. Physical therapy started on day #1 for gait training and transfer. OT started day #1 for ADL and assisted devices.  Patient's IV was d/c on day #1. Patient was able to safely and independently complete all PT goals. PT recommending discharge to home.    On post op day #1 patient was stable and ready for discharge to home with HHPT.  Implants: Femur: Persona Size 11 CR   Tibia: Persona Size H w/ 14x71mm stem extension  Poly: 11mm MC  Patella: 35x10mm symmetric   He was given perioperative  antibiotics:  Anti-infectives (From admission, onward)    Start     Dose/Rate Route Frequency Ordered Stop   02/24/23 2000  ceFAZolin (ANCEF) IVPB 2g/100 mL premix        2 g 200 mL/hr over 30 Minutes Intravenous Every 6 hours 02/24/23 1636 02/25/23 0129   02/24/23 0600  ceFAZolin (ANCEF) IVPB 2g/100 mL premix        2 g 200 mL/hr over 30 Minutes Intravenous On call to O.R. 02/23/23 2247 02/24/23 1402     .  He was given sequential compression devices, early ambulation, and Lovenox for DVT prophylaxis.  He benefited maximally from the hospital stay and there were no complications.    Recent vital signs:  Vitals:   02/25/23 0600 02/25/23 0749  BP: 137/76 (!) 145/66  Pulse: 70 71  Resp: 16 15  Temp: 98.6 F (37 C) 98.5 F (36.9 C)  SpO2: 97% 95%    Recent laboratory studies:  Lab Results  Component Value Date   HGB 15.7 02/25/2023   HGB 17.0 02/20/2023   HGB 16.6 05/16/2021   Lab Results  Component Value Date   WBC 18.6 (H) 02/25/2023   PLT 281 02/25/2023   No results found for: "INR" Lab Results  Component Value Date   NA 135 02/25/2023   K 4.0 02/25/2023   CL 103 02/25/2023   CO2 23 02/25/2023   BUN 25 (H) 02/25/2023   CREATININE 1.02 02/25/2023   GLUCOSE 115 (H) 02/25/2023    Discharge Medications:   Allergies as of 02/25/2023  Reactions   Statins Other (See Comments)   myalgias        Medication List     STOP taking these medications    ibuprofen 200 MG tablet Commonly known as: ADVIL       TAKE these medications    acetaminophen 500 MG tablet Commonly known as: TYLENOL Take 2 tablets (1,000 mg total) by mouth every 8 (eight) hours.   celecoxib 100 MG capsule Commonly known as: CeleBREX Take 1 capsule (100 mg total) by mouth 2 (two) times daily for 10 days.   COSAMIN DS PO Take 1 tablet by mouth in the morning and at bedtime.   docusate sodium 100 MG capsule Commonly known as: COLACE Take 1 capsule (100 mg total) by mouth  2 (two) times daily.   enoxaparin 40 MG/0.4ML injection Commonly known as: LOVENOX Inject 0.4 mLs (40 mg total) into the skin daily for 14 days.   ezetimibe 10 MG tablet Commonly known as: ZETIA Take 10 mg by mouth in the morning.   hydrochlorothiazide 12.5 MG tablet Commonly known as: HYDRODIURIL Take 12.5 mg by mouth daily with lunch.   meclizine 25 MG tablet Commonly known as: ANTIVERT Take 25 mg by mouth 3 (three) times daily as needed for dizziness.   ondansetron 4 MG tablet Commonly known as: ZOFRAN Take 1 tablet (4 mg total) by mouth every 6 (six) hours as needed for nausea.   oxyCODONE 5 MG immediate release tablet Commonly known as: Roxicodone Take 0.5-1 tablets (2.5-5 mg total) by mouth every 8 (eight) hours as needed.   traMADol 50 MG tablet Commonly known as: ULTRAM Take 1 tablet (50 mg total) by mouth every 6 (six) hours as needed for moderate pain (pain score 4-6).        Diagnostic Studies: DG Knee 1-2 Views Left Result Date: 02/24/2023 CLINICAL DATA:  Status post left knee replacement EXAM: LEFT KNEE - 2 VIEW COMPARISON:  None FINDINGS: Left knee replacement is noted. Air is noted in the surgical bed. No acute abnormality seen. IMPRESSION: Status post left knee replacement. Electronically Signed   By: Alcide Clever M.D.   On: 02/24/2023 19:50    Disposition:      Follow-up Information     Evon Slack, PA-C Follow up in 2 week(s).   Specialties: Orthopedic Surgery, Emergency Medicine Contact information: [address] [place] [address] [phone]                  Signed: Patience Musca 02/25/2023, 8:22 AM

## 2023-02-25 NOTE — Plan of Care (Signed)
Problem: Education: Goal: Knowledge of General Education information will improve Description Including pain rating scale, medication(s)/side effects and non-pharmacologic comfort measures Outcome: Progressing   Problem: Clinical Measurements: Goal: Ability to maintain clinical measurements within normal limits will improve Outcome: Progressing   Problem: Nutrition: Goal: Adequate nutrition will be maintained Outcome: Progressing   Problem: Coping: Goal: Level of anxiety will decrease Outcome: Progressing   Problem: Safety: Goal: Ability to remain free from injury will improve Outcome: Progressing

## 2023-02-26 ENCOUNTER — Emergency Department
Admission: EM | Admit: 2023-02-26 | Discharge: 2023-02-26 | Discharge status: Against Medical Advice | Payer: Medicare Other

## 2023-02-26 NOTE — ED Notes (Signed)
Patient called x3 for triage with no answer from lobby.

## 2023-03-13 ENCOUNTER — Ambulatory Visit: Payer: Medicare Other | Admitting: Urology

## 2023-03-19 ENCOUNTER — Other Ambulatory Visit: Payer: Self-pay

## 2023-03-19 DIAGNOSIS — N138 Other obstructive and reflux uropathy: Secondary | ICD-10-CM

## 2023-03-19 DIAGNOSIS — Z87448 Personal history of other diseases of urinary system: Secondary | ICD-10-CM

## 2023-03-20 ENCOUNTER — Ambulatory Visit: Payer: Self-pay | Admitting: Urology

## 2023-04-28 ENCOUNTER — Ambulatory Visit (INDEPENDENT_AMBULATORY_CARE_PROVIDER_SITE_OTHER): Payer: Medicare Other | Admitting: Urology

## 2023-04-28 VITALS — BP 164/81 | HR 82 | Ht 72.0 in | Wt 180.0 lb

## 2023-04-28 DIAGNOSIS — N401 Enlarged prostate with lower urinary tract symptoms: Secondary | ICD-10-CM | POA: Diagnosis not present

## 2023-04-28 DIAGNOSIS — N529 Male erectile dysfunction, unspecified: Secondary | ICD-10-CM

## 2023-04-28 DIAGNOSIS — N138 Other obstructive and reflux uropathy: Secondary | ICD-10-CM | POA: Diagnosis not present

## 2023-04-28 MED ORDER — TADALAFIL 20 MG PO TABS: 20.0000 mg | ORAL_TABLET | Freq: Every day | ORAL | 11 refills | Status: AC | PRN

## 2023-04-28 NOTE — Progress Notes (Signed)
   04/28/2023 10:16 AM   Naida Sleight [birth year] [id number]  Reason for visit: Follow up BPH status post HOLEP, ED  HPI: 78 year old male who presented with recurrent gross hematuria felt to be from BPH and numerous bladder stones, as well as obstructive urinary symptoms including weak stream, urgency/frequency, and sensation of incomplete emptying.  In April 2023 he underwent uncomplicated HOLEP with cystolitholapaxy of numerous bladder stones, pathology showed only 46g benign prostate tissue.  He has been doing well since that time and is urinating with a strong stream.  Gross hematuria has resolved.  He denies any incontinence.  Very pleased with his urinary symptoms at this time.  He also has ED and currently using Cialis 20 mg on demand with only mild to moderate results.  He has no problem obtaining erection but difficulty maintaining erections.  We discussed other options like taking a low-dose Cialis daily with a boost dose as needed, or considering a penile ring.  Cialis refilled RTC 1 year PVR, consider testosterone if persistent ED problems at that visit  Sondra Come, MD  [facility] [address], [address] [place], [place] [address] [phone]

## 2023-04-28 NOTE — Patient Instructions (Addendum)
 You can take Cialis 20 mg as needed 45 minutes prior to sexual activity.  You could also try taking Cialis 10 mg daily with a 10 mg boost dose as needed 45 minutes prior to sexual activity.  You could also consider a silicone/rubber penile ring to keep the penis erect, this can be purchased at [email or web address]  Erectile Dysfunction Erectile dysfunction (ED) is the inability to get or keep an erection in order to have sexual intercourse. ED is considered a symptom of an underlying disorder and is not considered a disease. ED may include: Inability to get an erection. Lack of enough hardness of the erection to allow penetration. Loss of erection before sex is finished. What are the causes? This condition may be caused by: Physical causes, such as: Artery problems. This may include heart disease, high blood pressure, atherosclerosis, and diabetes. Hormonal problems, such as low testosterone. Obesity. Nerve problems. This may include back or pelvic injuries, multiple sclerosis, Parkinson's disease, spinal cord injury, and stroke. Certain medicines, such as: Pain relievers. Antidepressants. Blood pressure medicines and water pills (diuretics). Cancer medicines. Antihistamines. Muscle relaxants. Lifestyle factors, such as: Use of drugs such as marijuana, cocaine, or opioids. Excessive use of alcohol. Smoking. Lack of physical activity or exercise. Psychological causes, such as: Anxiety or stress. Sadness or depression. Exhaustion. Fear about sexual performance. Guilt. What are the signs or symptoms? Symptoms of this condition include: Inability to get an erection. Lack of enough hardness of the erection to allow penetration. Loss of the erection before sex is finished. Sometimes having normal erections, but with frequent unsatisfactory episodes. Low sexual satisfaction in either partner due to erection problems. A curved penis occurring with erection. The curve may cause pain, or the penis  may be too curved to allow for intercourse. Never having nighttime or morning erections. How is this diagnosed? This condition is often diagnosed by: Performing a physical exam to find other diseases or specific problems with the penis. Asking you detailed questions about the problem. Doing tests, such as: Blood tests to check for diabetes mellitus or high cholesterol, or to measure hormone levels. Other tests to check for underlying health conditions. An ultrasound exam to check for scarring. A test to check blood flow to the penis. Doing a sleep study at home to measure nighttime erections. How is this treated? This condition may be treated by: Medicines, such as: Medicine taken by mouth to help you achieve an erection (oral medicine). Hormone replacement therapy to replace low testosterone levels. Medicine that is injected into the penis. Your health care provider may instruct you how to give yourself these injections at home. Medicine that is delivered with a short applicator tube. The tube is inserted into the opening at the tip of the penis, which is the opening of the urethra. A tiny pellet of medicine is put in the urethra. The pellet dissolves and enhances erectile function. This is also called MUSE (medicated urethral system for erections) therapy. Vacuum pump. This is a pump with a ring on it. The pump and ring are placed on the penis and used to create pressure that helps the penis become erect. Penile implant surgery. In this procedure, you may receive: An inflatable implant. This consists of cylinders, a pump, and a reservoir. The cylinders can be inflated with a fluid that helps to create an erection, and they can be deflated after intercourse. A semi-rigid implant. This consists of two silicone rubber rods. The rods provide some rigidity. They are also flexible,  so the penis can both curve downward in its normal position and become straight for sexual intercourse. Blood vessel  surgery to improve blood flow to the penis. During this procedure, a blood vessel from a different part of the body is placed into the penis to allow blood to flow around (bypass) damaged or blocked blood vessels. Lifestyle changes, such as exercising more, losing weight, and quitting smoking. Follow these instructions at home: Medicines  Take over-the-counter and prescription medicines only as told by your health care provider. Do not increase the dosage without first discussing it with your health care provider. If you are using self-injections, do injections as directed by your health care provider. Make sure you avoid any veins that are on the surface of the penis. After giving an injection, apply pressure to the injection site for 5 minutes. Talk to your health care provider about how to prevent headaches while taking ED medicines. These medicines may cause a sudden headache due to the increase in blood flow in your body. General instructions Exercise regularly, as directed by your health care provider. Work with your health care provider to lose weight, if needed. Do not use any products that contain nicotine or tobacco. These products include cigarettes, chewing tobacco, and vaping devices, such as e-cigarettes. If you need help quitting, ask your health care provider. Before using a vacuum pump, read the instructions that come with the pump and discuss any questions with your health care provider. Keep all follow-up visits. This is important. Contact a health care provider if: You feel nauseous. You are vomiting. You get sudden headaches while taking ED medicines. You have any concerns about your sexual health. Get help right away if: You are taking oral or injectable medicines and you have an erection that lasts longer than 4 hours. If your health care provider is unavailable, go to the nearest emergency room for evaluation. An erection that lasts much longer than 4 hours can result in  permanent damage to your penis. You have severe pain in your groin or abdomen. You develop redness or severe swelling of your penis. You have redness spreading at your groin or lower abdomen. You are unable to urinate. You experience chest pain or a rapid heartbeat (palpitations) after taking oral medicines. These symptoms may represent a serious problem that is an emergency. Do not wait to see if the symptoms will go away. Get medical help right away. Call your local emergency services (911 in the [place] Do not drive yourself to the hospital. Summary Erectile dysfunction (ED) is the inability to get or keep an erection during sexual intercourse. This condition is diagnosed based on a physical exam, your symptoms, and tests to determine the cause. Treatment varies depending on the cause and may include medicines, hormone therapy, surgery, or a vacuum pump. You may need follow-up visits to make sure that you are using your medicines or devices correctly. Get help right away if you are taking or injecting medicines and you have an erection that lasts longer than 4 hours. This information is not intended to replace advice given to you by your health care provider. Make sure you discuss any questions you have with your health care provider. Document Revised: 04/12/2020 Document Reviewed: 04/12/2020 Elsevier Patient Education  2024 [employer].

## 2023-10-01 ENCOUNTER — Encounter: Payer: Self-pay | Admitting: Urology

## 2024-05-05 ENCOUNTER — Ambulatory Visit: Admitting: Urology

## 2024-05-06 ENCOUNTER — Ambulatory Visit: Admitting: Urology
